# Patient Record
Sex: Female | Born: 1955 | Race: Black or African American | Hispanic: No | Marital: Married | Smoking: Never smoker
Health system: Southern US, Community
[De-identification: ages and names within clinical notes are randomized; demographics above are authoritative.]

## PROBLEM LIST (undated history)

## (undated) DIAGNOSIS — M1990 Unspecified osteoarthritis, unspecified site: Secondary | ICD-10-CM

## (undated) DIAGNOSIS — G4730 Sleep apnea, unspecified: Secondary | ICD-10-CM

## (undated) DIAGNOSIS — N1831 Chronic kidney disease, stage 3a: Secondary | ICD-10-CM

## (undated) DIAGNOSIS — R12 Heartburn: Secondary | ICD-10-CM

## (undated) DIAGNOSIS — I129 Hypertensive chronic kidney disease with stage 1 through stage 4 chronic kidney disease, or unspecified chronic kidney disease: Secondary | ICD-10-CM

## (undated) DIAGNOSIS — Z973 Presence of spectacles and contact lenses: Secondary | ICD-10-CM

## (undated) DIAGNOSIS — I10 Essential (primary) hypertension: Secondary | ICD-10-CM

## (undated) DIAGNOSIS — N2889 Other specified disorders of kidney and ureter: Secondary | ICD-10-CM

## (undated) DIAGNOSIS — E119 Type 2 diabetes mellitus without complications: Secondary | ICD-10-CM

## (undated) HISTORY — DX: Sleep apnea, unspecified: G47.30

## (undated) HISTORY — PX: TONSILLECTOMY: SUR1361

## (undated) HISTORY — DX: Type 2 diabetes mellitus without complications: E11.9

## (undated) HISTORY — DX: Essential (primary) hypertension: I10

## (undated) HISTORY — DX: Unspecified osteoarthritis, unspecified site: M19.90

## (undated) HISTORY — PX: ABDOMINAL HYSTERECTOMY: SHX81

---

## 2004-02-29 ENCOUNTER — Encounter: Payer: Self-pay | Admitting: Ophthalmology

## 2004-02-29 ENCOUNTER — Ambulatory Visit (HOSPITAL_COMMUNITY): Admission: RE | Admit: 2004-02-29 | Discharge: 2004-02-29 | Payer: Self-pay | Admitting: Ophthalmology

## 2005-09-25 ENCOUNTER — Emergency Department (HOSPITAL_COMMUNITY): Admission: EM | Admit: 2005-09-25 | Discharge: 2005-09-25 | Payer: Self-pay | Admitting: Emergency Medicine

## 2006-04-16 ENCOUNTER — Ambulatory Visit: Payer: Self-pay | Admitting: Family Medicine

## 2006-06-11 ENCOUNTER — Ambulatory Visit: Payer: Self-pay | Admitting: Family Medicine

## 2006-06-27 ENCOUNTER — Ambulatory Visit: Payer: Self-pay | Admitting: Family Medicine

## 2006-11-05 ENCOUNTER — Ambulatory Visit: Payer: Self-pay | Admitting: Obstetrics and Gynecology

## 2006-11-15 ENCOUNTER — Inpatient Hospital Stay: Payer: Self-pay | Admitting: Obstetrics and Gynecology

## 2009-01-19 ENCOUNTER — Emergency Department (HOSPITAL_COMMUNITY): Admission: EM | Admit: 2009-01-19 | Discharge: 2009-01-19 | Payer: Self-pay | Admitting: Family Medicine

## 2009-07-01 ENCOUNTER — Ambulatory Visit: Payer: Self-pay | Admitting: Internal Medicine

## 2009-07-31 ENCOUNTER — Encounter: Admission: RE | Admit: 2009-07-31 | Discharge: 2009-07-31 | Payer: Self-pay | Admitting: Neurology

## 2009-08-06 ENCOUNTER — Ambulatory Visit: Payer: Self-pay | Admitting: Family Medicine

## 2011-01-26 ENCOUNTER — Ambulatory Visit: Payer: Self-pay | Admitting: Family Medicine

## 2011-08-10 ENCOUNTER — Ambulatory Visit: Payer: Self-pay | Admitting: Unknown Physician Specialty

## 2011-08-12 LAB — PATHOLOGY REPORT

## 2012-02-27 IMAGING — CT CT HEAD WITHOUT CONTRAST
1 series · 16 of 30 positions shown, 20 images · non-contrast
Comparison: none

REASON FOR EXAM: Syncope    TIA   episode of slurred speech
COMMENTS:

[Series 2: soft tissue · axial · 0.39mm/px · z∈[-116,+19]mm · 16 of 30 slices shown, 20 images]
[im 2/30  brain]
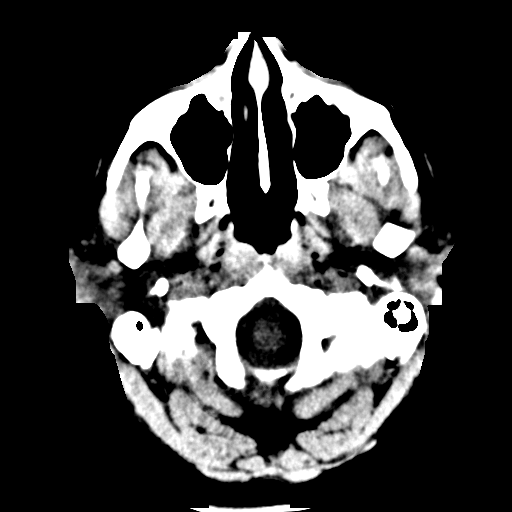
[im 2/30  bone]
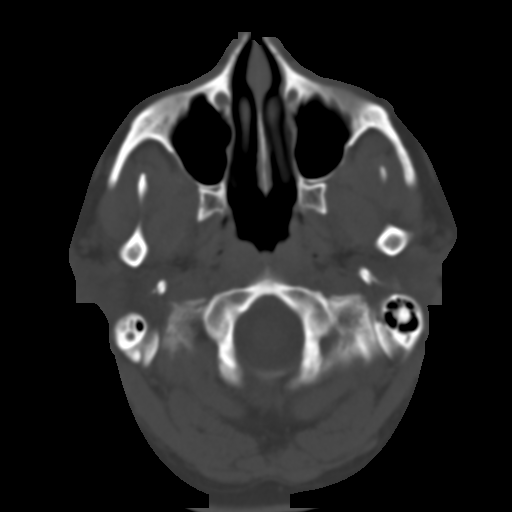
[im 4/30  brain]
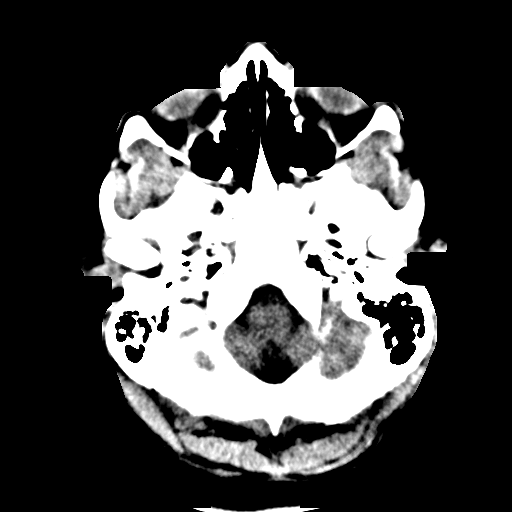
[im 6/30  brain]
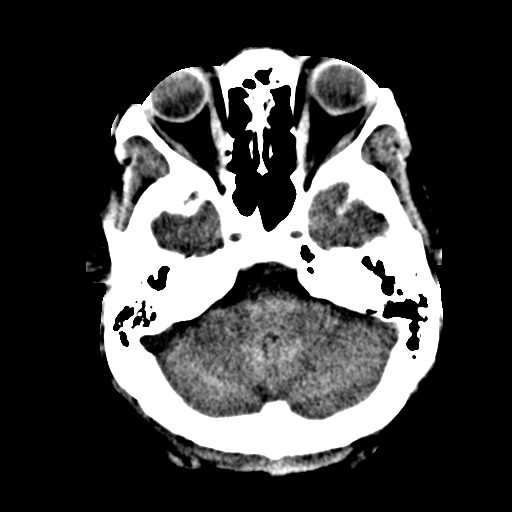
[im 8/30  brain]
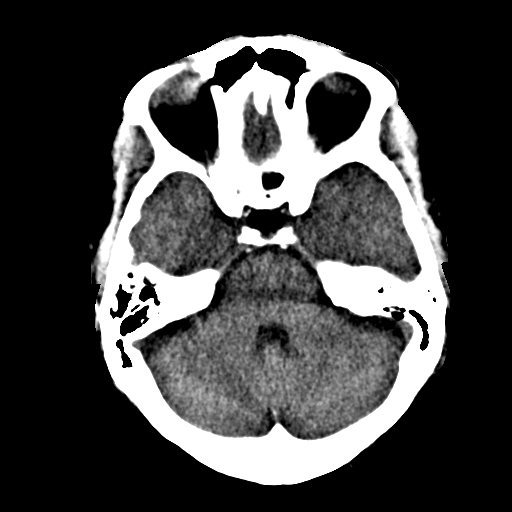
[im 9/30  brain]
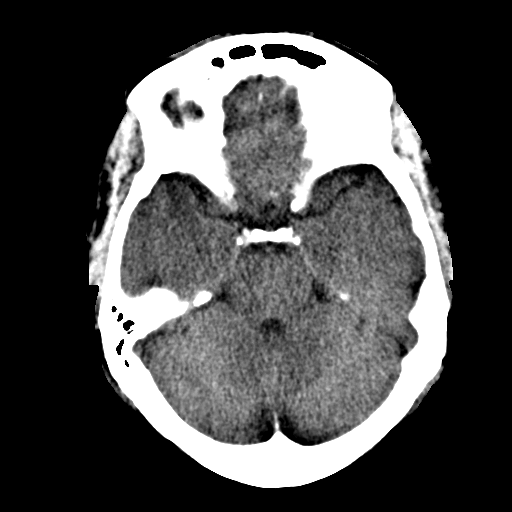
[im 9/30  bone]
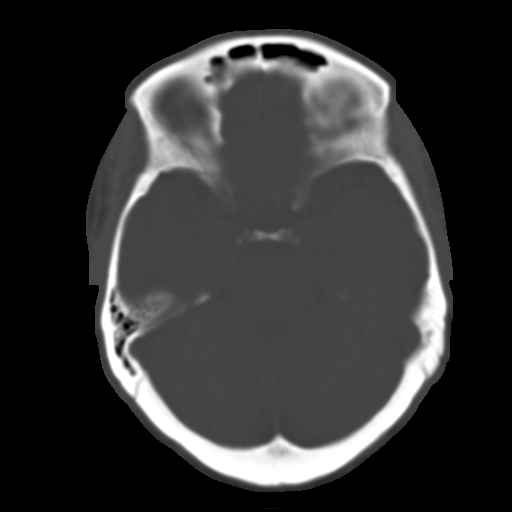
[im 11/30  brain]
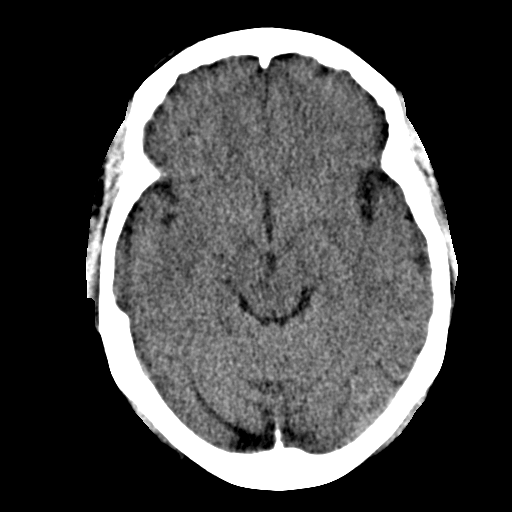
[im 13/30  brain]
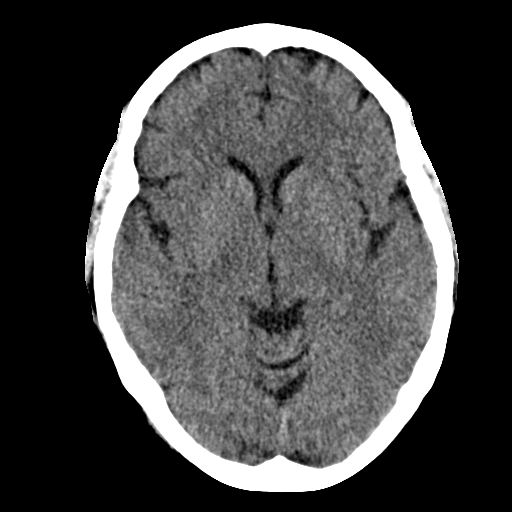
[im 15/30  brain]
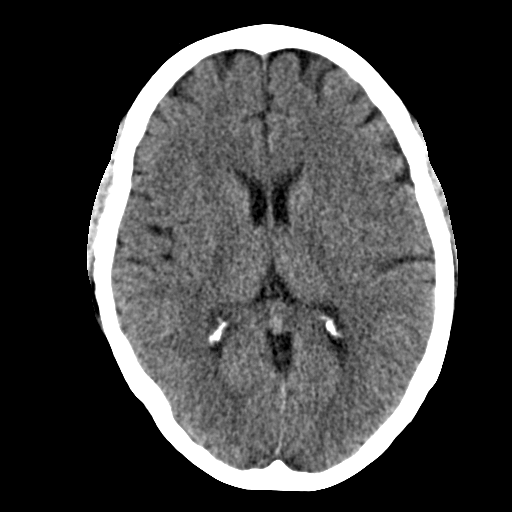
[im 16/30  brain]
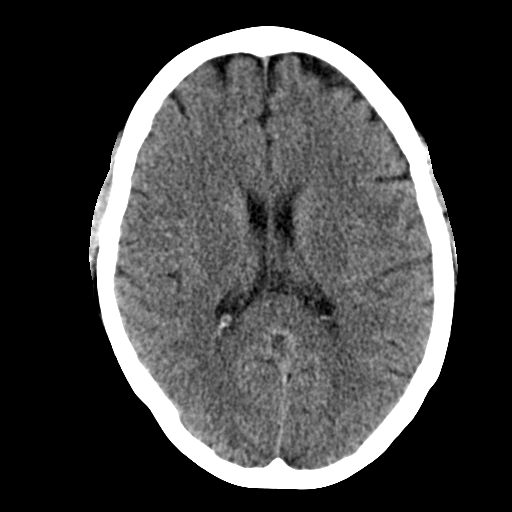
[im 16/30  bone]
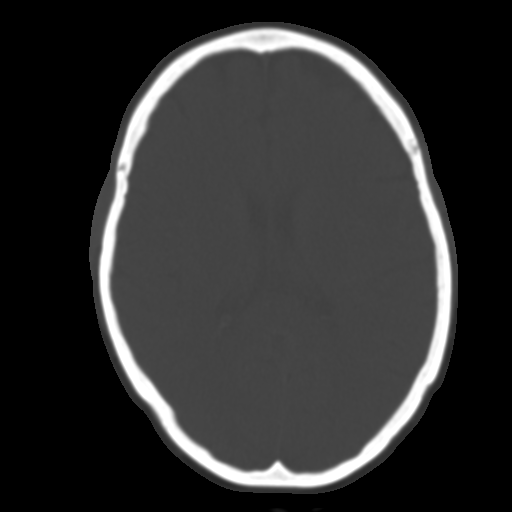
[im 18/30  brain]
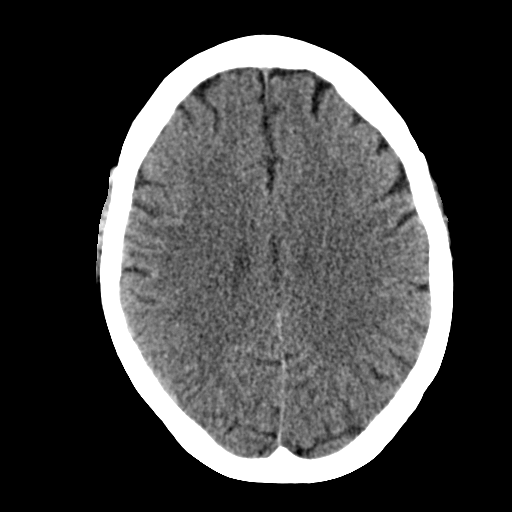
[im 20/30  brain]
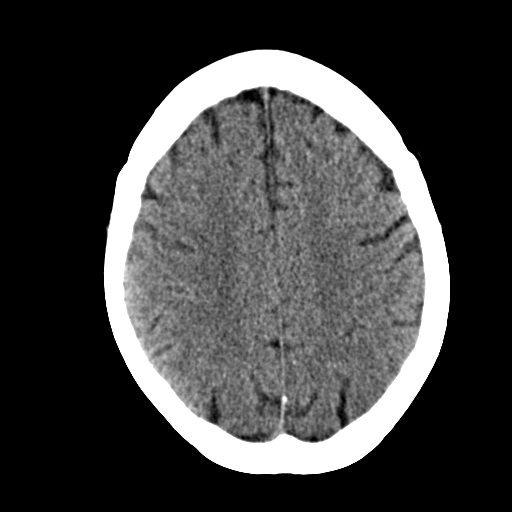
[im 22/30  brain]
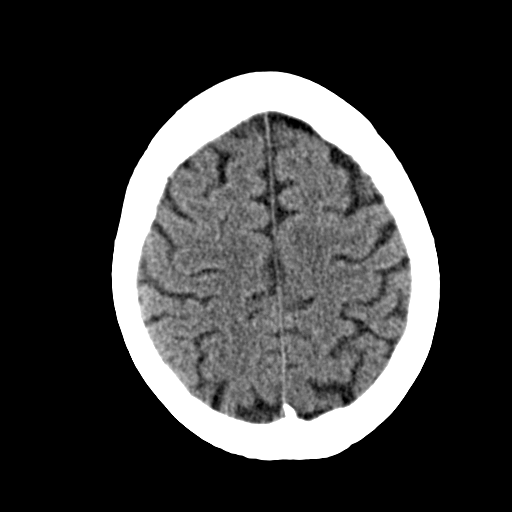
[im 23/30  brain]
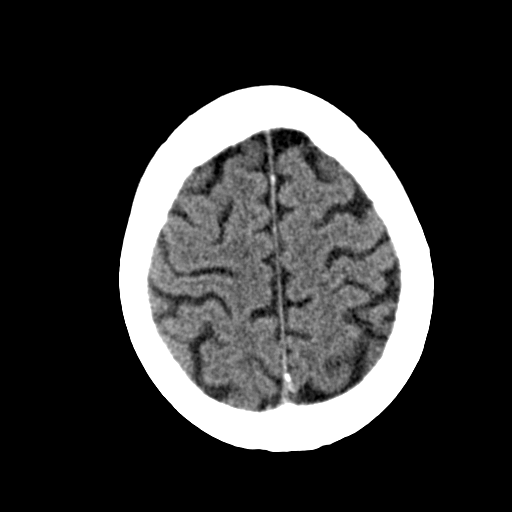
[im 23/30  bone]
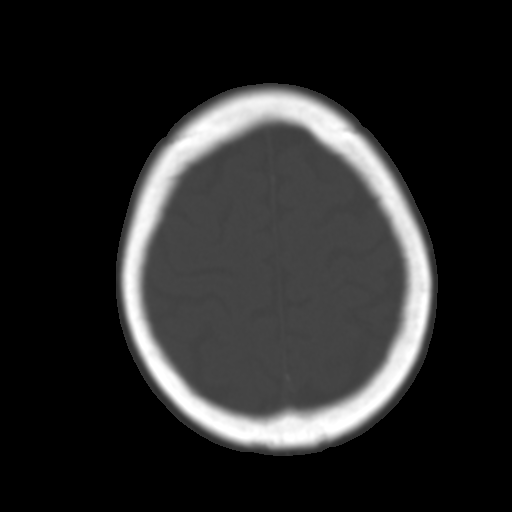
[im 25/30  brain]
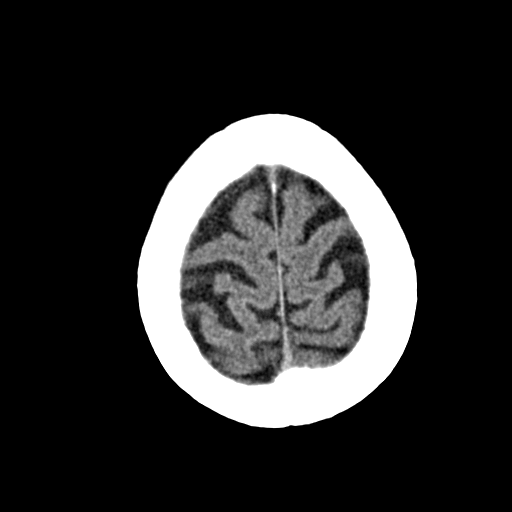
[im 27/30  brain]
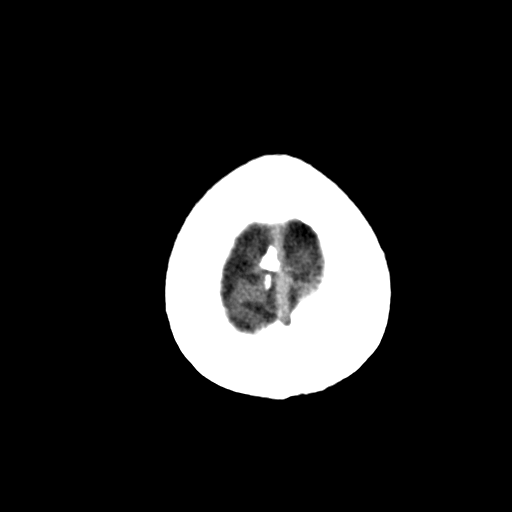
[im 29/30  brain]
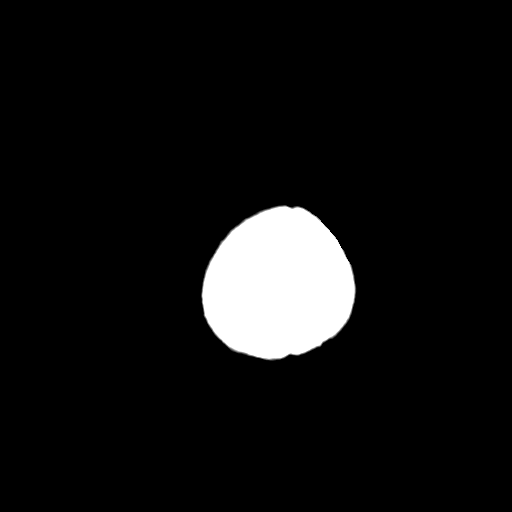

[16 of 30 positions shown; findings below may reference images not displayed]

PROCEDURE:     CT  - CT HEAD WITHOUT CONTRAST  - July 01, 2009 [DATE]

RESULT:     Axial noncontrast CT scanning was performed through the brain at
5 mm intervals and slice thicknesses.

The ventricles are normal in size and position. There is no intracranial
hemorrhage nor intracranial mass effect. I see no evidence of an evolving
ischemic infarction. The cerebellum and brainstem are normal in density.

At bone window settings the observed portions of the paranasal sinuses and
mastoid air cells are clear. There is no evidence of an acute skull fracture.
IMPRESSION: 1. I do not see evidence of an acute ischemic or hemorrhagic infarction.
Followup imaging is available upon request.
2. I see no acute abnormality elsewhere within the brain.

## 2015-03-07 ENCOUNTER — Institutional Professional Consult (permissible substitution): Payer: Self-pay | Admitting: Internal Medicine

## 2015-04-02 ENCOUNTER — Other Ambulatory Visit (HOSPITAL_COMMUNITY): Payer: Self-pay | Admitting: General Surgery

## 2015-04-26 ENCOUNTER — Ambulatory Visit (INDEPENDENT_AMBULATORY_CARE_PROVIDER_SITE_OTHER): Payer: Managed Care, Other (non HMO) | Admitting: Licensed Clinical Social Worker

## 2015-05-02 ENCOUNTER — Ambulatory Visit (HOSPITAL_COMMUNITY): Admission: RE | Admit: 2015-05-02 | Payer: Self-pay | Source: Ambulatory Visit

## 2015-05-02 ENCOUNTER — Other Ambulatory Visit (HOSPITAL_COMMUNITY): Payer: Self-pay

## 2015-05-10 ENCOUNTER — Ambulatory Visit: Payer: Managed Care, Other (non HMO) | Admitting: Licensed Clinical Social Worker

## 2015-05-27 ENCOUNTER — Ambulatory Visit (INDEPENDENT_AMBULATORY_CARE_PROVIDER_SITE_OTHER): Payer: Managed Care, Other (non HMO) | Admitting: Licensed Clinical Social Worker

## 2015-05-29 ENCOUNTER — Encounter: Payer: Managed Care, Other (non HMO) | Attending: General Surgery | Admitting: Dietician

## 2015-05-29 ENCOUNTER — Encounter: Payer: Self-pay | Admitting: Dietician

## 2015-05-29 DIAGNOSIS — I10 Essential (primary) hypertension: Secondary | ICD-10-CM | POA: Insufficient documentation

## 2015-05-29 DIAGNOSIS — M47812 Spondylosis without myelopathy or radiculopathy, cervical region: Secondary | ICD-10-CM | POA: Insufficient documentation

## 2015-05-29 DIAGNOSIS — K219 Gastro-esophageal reflux disease without esophagitis: Secondary | ICD-10-CM | POA: Diagnosis not present

## 2015-05-29 DIAGNOSIS — M1990 Unspecified osteoarthritis, unspecified site: Secondary | ICD-10-CM | POA: Diagnosis not present

## 2015-05-29 DIAGNOSIS — Z6841 Body Mass Index (BMI) 40.0 and over, adult: Secondary | ICD-10-CM | POA: Diagnosis not present

## 2015-05-29 DIAGNOSIS — G4730 Sleep apnea, unspecified: Secondary | ICD-10-CM | POA: Diagnosis not present

## 2015-05-29 NOTE — Patient Instructions (Signed)
Follow Pre-Op Goals Try Protein Shakes Call [facility] at [phone] when surgery is scheduled to enroll in Pre-Op Class  Things to remember:  Please always be honest with us. We want to support you!  If you have any questions or concerns in between appointments, please call or email Liz, Lansing Sigmon, or Laurie.  The diet after surgery will be high protein and low in carbohydrate.  Vitamins and calcium need to be taken for the rest of your life.  Feel free to include support people in any classes or appointments.   Supplement recommendations:  "Complete" Multivitamin: Sleeve Gastrectomy and RYGB patients take a double dose of MVI. Vitamin must be liquid or chewable but not gummy. Examples of these include Flintstones Complete and Centrum Complete. If the vitamin is bariatric-specific, take 1 dose as it is already formulated for bariatric surgery patients. Examples of these are Bariatric Advantage, Celebrate, and Wellesse. These can be found at the [facility] and/or online.     Calcium citrate: 1500 mg/day of Calcium citrate (also chewable or liquid) is recommended for all procedures. The body is only able to absorb 500-600 mg of Calcium at one time so 3 daily doses of 500 mg are recommended. Calcium doses must be taken a minimum of 2 hours apart. Additionally, Calcium must be taken 2 hours apart from iron-containing MVI. Examples of brands include Celebrate, Bariatric Advantage, and Wellesse. These brands must be purchased online or at the [facility]. Citracal Petites is the only Calcium citrate supplement found in general grocery stores and pharmacies. This is in tablet form and may be recommended for patients who do not tolerate chewable Calcium.  Continued or added Vitamin D supplementation based on individual needs.    Vitamin B12: 300-500 mcg/day for Sleeve Gastrectomy and RYGB. Must be taken intramuscularly, sublingually, or inhaled nasally. Oral route  is not recommended.  

## 2015-05-29 NOTE — Progress Notes (Signed)
  Pre-Op Assessment Visit:  Pre-Operative Sleeve Gastrectomy Surgery  Medical Nutrition Therapy:  Appt start time: 1040   End time:  1115.  Patient was seen on 05/29/2015 for Pre-Operative Nutrition Assessment. Assessment and letter of approval [facility] coordinator on 05/29/2015.   Preferred Learning Style:   No preference indicated   Learning Readiness:   Ready  Handouts given during visit include:  Pre-Op Goals Bariatric Surgery Protein Shakes   During the appointment today the following Pre-Op Goals were reviewed with the patient: Maintain or lose weight as instructed by your surgeon Make healthy food choices Begin to limit portion sizes Limited concentrated sugars and fried foods Keep fat/sugar in the single digits per serving on   food labels Practice CHEWING your food  (aim for 30 chews per bite or until applesauce consistency) Practice not drinking 15 minutes before, during, and 30 minutes after each meal/snack Avoid all carbonated beverages  Avoid/limit caffeinated beverages  Avoid all sugar-sweetened beverages Consume 3 meals per day; eat every 3-5 hours Make a list of non-food related activities Aim for 64-100 ounces of FLUID daily  Aim for at least 60-80 grams of PROTEIN daily Look for a liquid protein source that contain ?15 g protein and ?5 g carbohydrate  (ex: shakes, drinks, shots)  Demonstrated degree of understanding via:  Teach Back  Teaching Method Utilized:  Visual Auditory Hands on  Barriers to learning/adherence to lifestyle change: none  [facility] to call the [facility] to enroll in Pre-Op and Post-Op Nutrition Education when surgery date is scheduled.

## 2015-06-10 ENCOUNTER — Ambulatory Visit: Payer: Managed Care, Other (non HMO)

## 2015-06-10 ENCOUNTER — Ambulatory Visit
Admission: RE | Admit: 2015-06-10 | Discharge: 2015-06-10 | Disposition: A | Discharge status: Home / Self Care | Payer: Managed Care, Other (non HMO) | Source: Ambulatory Visit | Attending: General Surgery | Admitting: General Surgery

## 2015-06-10 DIAGNOSIS — K219 Gastro-esophageal reflux disease without esophagitis: Secondary | ICD-10-CM | POA: Insufficient documentation

## 2015-07-03 ENCOUNTER — Other Ambulatory Visit: Payer: Self-pay | Admitting: Family

## 2015-07-03 DIAGNOSIS — Z1231 Encounter for screening mammogram for malignant neoplasm of breast: Secondary | ICD-10-CM

## 2015-07-04 ENCOUNTER — Encounter: Payer: Self-pay | Admitting: Dietician

## 2015-07-04 ENCOUNTER — Encounter: Payer: Managed Care, Other (non HMO) | Attending: General Surgery | Admitting: Dietician

## 2015-07-04 DIAGNOSIS — G4730 Sleep apnea, unspecified: Secondary | ICD-10-CM | POA: Insufficient documentation

## 2015-07-04 DIAGNOSIS — M1990 Unspecified osteoarthritis, unspecified site: Secondary | ICD-10-CM | POA: Insufficient documentation

## 2015-07-04 DIAGNOSIS — Z6841 Body Mass Index (BMI) 40.0 and over, adult: Secondary | ICD-10-CM | POA: Diagnosis not present

## 2015-07-04 DIAGNOSIS — K219 Gastro-esophageal reflux disease without esophagitis: Secondary | ICD-10-CM | POA: Diagnosis not present

## 2015-07-04 DIAGNOSIS — M47812 Spondylosis without myelopathy or radiculopathy, cervical region: Secondary | ICD-10-CM | POA: Diagnosis present

## 2015-07-04 DIAGNOSIS — I10 Essential (primary) hypertension: Secondary | ICD-10-CM | POA: Insufficient documentation

## 2015-07-04 NOTE — Patient Instructions (Signed)
-  Try to choose lean proteins and vegetables when possible -Start considering what your diet will be like after surgery  -Think about bringing some high protein options with you on your travels (yogurt, protein shakes, tuna packs)  -Try to make sure you eat regularly throughout the day

## 2015-07-04 NOTE — Progress Notes (Signed)
Supervised Weight Loss:  Appt start time: 0950 end time:  1005.  SWL visit 1:  Primary concerns today: Ms. Rosalia HammersRay returns for her 1st SWL appointment in preparation for sleeve gastrectomy having gained 2 pounds. She has been traveling a lot for work in the last month. She struggles to eat healthfully when she is traveling. However, she has been practicing chewing thoroughly and is also working on not drinking while eating. Has not yet tried any protein shakes. She does not have a normal routine and feels like this is a big challenge for her.   Weight: 258.8 lbs BMI: 45.9  MEDICATIONS: see list  DIETARY INTAKE:  Unable to determine regular dietary intake  Recent physical activity: did not assess today  Estimated energy needs: 1500-1700 calories  Progress Towards Goal(s):  In progress.   Nutritional Diagnosis:  [place]-3.3 Overweight/obesity related to past poor dietary habits and physical inactivity as evidenced by patient in SWL for pending bariatric surgery following dietary guidelines for continued weight loss.  Intervention:  Nutrition counseling provided. Goals: -Try to choose lean proteins and vegetables when possible -Start considering what your diet will be like after surgery  -Think about bringing some high protein options with you on your travels (yogurt, protein shakes, tuna packs)  -Try to make sure you eat regularly throughout the day  Handouts given during visit include:  none  Monitoring/Evaluation:  Dietary intake, exercise, and body weight in 4 week(s).

## 2015-08-01 ENCOUNTER — Ambulatory Visit: Payer: Self-pay | Admitting: Dietician

## 2015-08-06 ENCOUNTER — Encounter: Payer: Managed Care, Other (non HMO) | Attending: General Surgery | Admitting: Dietician

## 2015-08-06 ENCOUNTER — Encounter: Payer: Self-pay | Admitting: Dietician

## 2015-08-06 DIAGNOSIS — I10 Essential (primary) hypertension: Secondary | ICD-10-CM | POA: Diagnosis not present

## 2015-08-06 DIAGNOSIS — G4730 Sleep apnea, unspecified: Secondary | ICD-10-CM | POA: Insufficient documentation

## 2015-08-06 DIAGNOSIS — M47812 Spondylosis without myelopathy or radiculopathy, cervical region: Secondary | ICD-10-CM | POA: Diagnosis present

## 2015-08-06 DIAGNOSIS — M1990 Unspecified osteoarthritis, unspecified site: Secondary | ICD-10-CM | POA: Insufficient documentation

## 2015-08-06 DIAGNOSIS — Z6841 Body Mass Index (BMI) 40.0 and over, adult: Secondary | ICD-10-CM | POA: Diagnosis not present

## 2015-08-06 DIAGNOSIS — K219 Gastro-esophageal reflux disease without esophagitis: Secondary | ICD-10-CM | POA: Insufficient documentation

## 2015-08-06 NOTE — Patient Instructions (Signed)
-  Try to choose lean proteins (eggs, tuna, yogurt) and vegetables when possible -Try to choose lean proteins and vegetables when possible -Continue to work on not drinking while eating -Have fruit at meals after you have protein and vegetables -Or have fruit with protein shake (meal or 1/2 shake for snack)

## 2015-08-06 NOTE — Progress Notes (Signed)
Supervised Weight Loss:  Appt start time: 0850 end time:  905.  SWL visit 2:  Primary concerns today: Ms. Ashley Hardy returns for her 2nd SWL appointment in preparation for sleeve gastrectomy having lost 2 pounds. Was diagnosed with diabetes in the past month and is taking Metformin. Hgb A1c is 6.7%. Testing blood sugar and averaging 140-150 mg/dl.   Has been working on high protein snacks for traveling. Does not like beef jerky but is having slim jims. Likes [employer]. [place] for work and does better with 3 meals per day when traveling. Still working on chewing. Finding it harder to not drink during meals.    Weight: 256.5 lbs  BMI: 45.4  MEDICATIONS: see list  DIETARY INTAKE:  Unable to determine regular dietary intake  Recent physical activity: did not assess today  Estimated energy needs: 1500-1700 calories  Progress Towards Goal(s):  In progress.   Nutritional Diagnosis:  [place]-3.3 Overweight/obesity related to past poor dietary habits and physical inactivity as evidenced by patient in SWL for pending bariatric surgery following dietary guidelines for continued weight loss.  Intervention:  Nutrition counseling provided. Goals: -Try to choose lean proteins (eggs, tuna, yogurt) and vegetables when possible -Try to choose lean proteins and vegetables when possible -Continue to work on not drinking while eating -Have fruit at meals after you have protein and vegetables -Or have fruit with protein shake (meal or 1/2 shake for snack)  Handouts given during visit include:  none  Monitoring/Evaluation:  Dietary intake, exercise, and body weight in 4 week(s).

## 2015-08-23 ENCOUNTER — Encounter: Payer: Managed Care, Other (non HMO) | Attending: General Surgery | Admitting: *Deleted

## 2015-08-23 ENCOUNTER — Encounter: Payer: Self-pay | Admitting: *Deleted

## 2015-08-23 VITALS — Ht 63.0 in | Wt 260.3 lb

## 2015-08-23 DIAGNOSIS — K219 Gastro-esophageal reflux disease without esophagitis: Secondary | ICD-10-CM | POA: Insufficient documentation

## 2015-08-23 DIAGNOSIS — G4730 Sleep apnea, unspecified: Secondary | ICD-10-CM | POA: Insufficient documentation

## 2015-08-23 DIAGNOSIS — Z6841 Body Mass Index (BMI) 40.0 and over, adult: Secondary | ICD-10-CM | POA: Insufficient documentation

## 2015-08-23 DIAGNOSIS — I10 Essential (primary) hypertension: Secondary | ICD-10-CM | POA: Diagnosis not present

## 2015-08-23 DIAGNOSIS — M47812 Spondylosis without myelopathy or radiculopathy, cervical region: Secondary | ICD-10-CM | POA: Diagnosis present

## 2015-08-23 DIAGNOSIS — M1990 Unspecified osteoarthritis, unspecified site: Secondary | ICD-10-CM | POA: Insufficient documentation

## 2015-08-23 DIAGNOSIS — E119 Type 2 diabetes mellitus without complications: Secondary | ICD-10-CM

## 2015-08-23 NOTE — Patient Instructions (Signed)
Plan:  Aim for 3 Carb Choices per meal (45 grams) +/- 1 either way  Aim for 0-2 Carbs per snack if hungry  Include protein in moderation with your meals and snacks Consider reading food labels for Total Carbohydrate of foods Consider  increasing your activity level by walking daily as tolerated Continue checking BG at alternate times per day  Continue taking medication Metformin as directed by MD    

## 2015-08-23 NOTE — Progress Notes (Signed)
Diabetes Self-Management Education  Visit Type: First/Initial  Appt. Start Time: 0830 Appt. End Time: 1000  08/23/2015  Ashley Hardy, identified by name and date of birth, is a 60 y.o. female with a diagnosis of Diabetes: Type 2.   ASSESSMENT  Height  (1.6 m), weight 260 lb 4.8 oz (118.071 kg). Body mass index is 46.12 kg/(m^2).      Diabetes Self-Management Education - 08/23/15 0845    Visit Information   Visit Type First/Initial   Initial Visit   Diabetes Type Type 2   Are you currently following a meal plan? No   Are you taking your medications as prescribed? Yes   Date Diagnosed May 2017   Health Coping   How would you rate your overall health? Fair   Psychosocial Assessment   Patient Belief/Attitude about Diabetes Afraid  defeated and burned out   Self-care barriers None   Other persons present Patient   Patient Concerns Nutrition/Meal planning;Glycemic Control   Special Needs None   How often do you need to have someone help you when you read instructions, pamphlets, or other written materials from your doctor or pharmacy? 1 - Never   What is the last grade level you completed in school? graduate degree   Pre-Education Assessment   Patient understands the diabetes disease and treatment process. Needs Instruction   Patient understands incorporating nutritional management into lifestyle. Needs Instruction   Patient undertands incorporating physical activity into lifestyle. Needs Review   Patient understands using medications safely. Demonstrates understanding / competency   Patient understands monitoring blood glucose, interpreting and using results Demonstrates understanding / competency   Patient understands prevention, detection, and treatment of acute complications. Needs Review   Patient understands prevention, detection, and treatment of chronic complications. Demonstrates understanding / competency   Patient understands how to develop strategies to  address psychosocial issues. Needs Instruction   Patient understands how to develop strategies to promote health/change behavior. Needs Review   Complications   Last HgB A1C per patient/outside source 6.7 %   How often do you check your blood sugar? 1-2 times/day   Fasting Blood glucose range (mg/dL) 16-109;604-540   Have you had a dilated eye exam in the past 12 months? Yes   Have you had a dental exam in the past 12 months? Yes   Are you checking your feet? No   Dietary Intake   Breakfast grabs any food when she gets hungry. 2 Pop tarts OR sweetened cereal with skim milk OR    Snack (morning) higher protein foods now, nuts, cheese, protein bars, jerky   Lunch sandwich OR left overs    Snack (afternoon) similar to AM   Dinner skips now but may fix dinner for childern in family   Snack (evening) no   Beverage(s) coffee with creamer, water, flavored water, diet soda   Exercise   Exercise Type ADL's  plans to walk with grandson during the summer   How many days per week to you exercise? 0   Patient Education   Previous Diabetes Education No   Disease state  Definition of diabetes, type 1 and 2, and the diagnosis of diabetes   Nutrition management  Role of diet in the treatment of diabetes and the relationship between the three main macronutrients and blood glucose level;Food label reading, portion sizes and measuring food.;Carbohydrate counting   Physical activity and exercise  Role of exercise on diabetes management, blood pressure control and cardiac health.   Medications  Reviewed patients medication for diabetes, action, purpose, timing of dose and side effects.   Monitoring Identified appropriate SMBG and/or A1C goals.;Purpose and frequency of SMBG.   Chronic complications Relationship between chronic complications and blood glucose control   Psychosocial adjustment Role of stress on diabetes   Individualized Goals (developed by patient)   Nutrition Follow meal plan discussed    Physical Activity Exercise 3-5 times per week   Medications take my medication as prescribed   Monitoring  test my blood glucose as discussed   Post-Education Assessment   Patient understands the diabetes disease and treatment process. Demonstrates understanding / competency   Patient understands incorporating nutritional management into lifestyle. Demonstrates understanding / competency   Patient undertands incorporating physical activity into lifestyle. Demonstrates understanding / competency   Patient understands using medications safely. Demonstrates understanding / competency   Patient understands monitoring blood glucose, interpreting and using results Demonstrates understanding / competency   Patient understands prevention, detection, and treatment of acute complications. Demonstrates understanding / competency   Patient understands prevention, detection, and treatment of chronic complications. Demonstrates understanding / competency   Patient understands how to develop strategies to address psychosocial issues. Demonstrates understanding / competency   Outcomes   Expected Outcomes Demonstrated interest in learning. Expect positive outcomes   Future DMSE PRN   Program Status Completed      Individualized Plan for Diabetes Self-Management Training:   Learning Objective:  Patient will have a greater understanding of diabetes self-management. Patient education plan is to attend individual and/or group sessions per assessed needs and concerns.   Plan:   Patient Instructions  Plan:  Aim for 3 Carb Choices per meal (45 grams) +/- 1 either way  Aim for 0-2 Carbs per snack if hungry  Include protein in moderation with your meals and snacks Consider reading food labels for Total Carbohydrate of foods Consider  increasing your activity level by walking daily as tolerated Continue checking BG at alternate times per day  Continue taking medication Metformin as directed by  MD      Expected Outcomes:  Demonstrated interest in learning. Expect positive outcomes  Education material provided: Living Well with Diabetes, A1C conversion sheet, Meal plan card and Carbohydrate counting sheet  If problems or questions, patient to contact team via:  Phone and Email  Future DSME appointment: PRN

## 2015-08-27 ENCOUNTER — Ambulatory Visit: Payer: Self-pay | Admitting: Skilled Nursing Facility1

## 2015-09-19 ENCOUNTER — Ambulatory Visit: Payer: Self-pay | Admitting: Dietician

## 2015-09-23 ENCOUNTER — Encounter: Payer: Self-pay | Admitting: Skilled Nursing Facility1

## 2015-09-23 ENCOUNTER — Encounter: Payer: Managed Care, Other (non HMO) | Attending: General Surgery | Admitting: Skilled Nursing Facility1

## 2015-09-23 VITALS — Ht 63.0 in | Wt 256.0 lb

## 2015-09-23 DIAGNOSIS — E669 Obesity, unspecified: Secondary | ICD-10-CM

## 2015-09-23 DIAGNOSIS — G4730 Sleep apnea, unspecified: Secondary | ICD-10-CM | POA: Diagnosis not present

## 2015-09-23 DIAGNOSIS — K219 Gastro-esophageal reflux disease without esophagitis: Secondary | ICD-10-CM | POA: Diagnosis not present

## 2015-09-23 DIAGNOSIS — I10 Essential (primary) hypertension: Secondary | ICD-10-CM | POA: Diagnosis not present

## 2015-09-23 DIAGNOSIS — Z6841 Body Mass Index (BMI) 40.0 and over, adult: Secondary | ICD-10-CM | POA: Insufficient documentation

## 2015-09-23 DIAGNOSIS — M1990 Unspecified osteoarthritis, unspecified site: Secondary | ICD-10-CM | POA: Diagnosis not present

## 2015-09-23 DIAGNOSIS — M47812 Spondylosis without myelopathy or radiculopathy, cervical region: Secondary | ICD-10-CM | POA: Diagnosis present

## 2015-09-23 NOTE — Progress Notes (Signed)
Supervised Weight Loss:  Appt start time: 0850 end time:  905.  SWL visit 2:  Primary concerns today: Ms. Rosalia HammersRay returns for her 4th SWL appointment in preparation for sleeve gastrectomy having lost 4 pounds. Pt states she is still working on not drinking on meals but has found the chewing recommendations to be easy. Pt states she has been drinking a shake a day.  Weight: 256 lbs  BMI: 45.4  MEDICATIONS: see list  DIETARY INTAKE:  Unable to determine regular dietary intake  Recent physical activity: did not assess today  Estimated energy needs: 1500-1700 calories  Progress Towards Goal(s):  In progress.   Nutritional Diagnosis:  [place]-3.3 Overweight/obesity related to past poor dietary habits and physical inactivity as evidenced by patient in SWL for pending bariatric surgery following dietary guidelines for continued weight loss.  Intervention:  Nutrition counseling provided. Goals: -Try to choose lean proteins (eggs, tuna, yogurt) and vegetables when possible -Try to choose lean proteins and vegetables when possible -Continue to work on not drinking while eating -Have fruit at meals after you have protein and vegetables -Or have fruit with protein shake (meal or 1/2 shake for snack)  Handouts given during visit include:  none  Monitoring/Evaluation:  Dietary intake, exercise, and body weight in 4 week(s).

## 2015-10-03 ENCOUNTER — Other Ambulatory Visit: Payer: Self-pay

## 2015-10-03 ENCOUNTER — Ambulatory Visit
Admission: RE | Admit: 2015-10-03 | Discharge: 2015-10-03 | Disposition: A | Discharge status: Home / Self Care | Payer: Managed Care, Other (non HMO) | Source: Ambulatory Visit | Attending: General Surgery | Admitting: General Surgery

## 2015-10-03 DIAGNOSIS — Z029 Encounter for administrative examinations, unspecified: Secondary | ICD-10-CM | POA: Diagnosis not present

## 2015-10-23 ENCOUNTER — Encounter: Payer: Self-pay | Admitting: Dietician

## 2015-10-23 ENCOUNTER — Encounter: Payer: Managed Care, Other (non HMO) | Attending: General Surgery | Admitting: Dietician

## 2015-10-23 DIAGNOSIS — M1990 Unspecified osteoarthritis, unspecified site: Secondary | ICD-10-CM | POA: Diagnosis not present

## 2015-10-23 DIAGNOSIS — K219 Gastro-esophageal reflux disease without esophagitis: Secondary | ICD-10-CM | POA: Diagnosis not present

## 2015-10-23 DIAGNOSIS — Z6841 Body Mass Index (BMI) 40.0 and over, adult: Secondary | ICD-10-CM | POA: Diagnosis not present

## 2015-10-23 DIAGNOSIS — G4730 Sleep apnea, unspecified: Secondary | ICD-10-CM | POA: Diagnosis not present

## 2015-10-23 DIAGNOSIS — M47812 Spondylosis without myelopathy or radiculopathy, cervical region: Secondary | ICD-10-CM | POA: Diagnosis present

## 2015-10-23 DIAGNOSIS — I10 Essential (primary) hypertension: Secondary | ICD-10-CM | POA: Diagnosis not present

## 2015-10-23 NOTE — Progress Notes (Signed)
Supervised Weight Loss:  Appt start time: 915 end time:  930  SWL visit 5:  Primary concerns today: Ms. Ashley Hardy returns for her 5th SWL appointment in preparation for sleeve gastrectomy having gained 1 pound. She reports feeling excited for surgery and really working on adopting a new lifestyle. Has been practicing chewing well and avoiding fluids with meals. Finds that she needs to be more conscious about not drinking with meals as this does not come naturally for her. Working on reducing sugar and increasing veggies. Has been having high protein snacks like nuts, beef jerky, and cheese. She drinks Premier protein shake and likes it. Has not had a diet soda in 2-3 weeks.   Weight: 257.2 lbs BMI: 45.5  MEDICATIONS: see list  DIETARY INTAKE:  Unable to determine regular dietary intake  Recent physical activity: did not assess today  Estimated energy needs: 1500-1700 calories  Progress Towards Goal(s):  In progress.   Nutritional Diagnosis:  [place]-3.3 Overweight/obesity related to past poor dietary habits and physical inactivity as evidenced by patient in SWL for pending bariatric surgery following dietary guidelines for continued weight loss.  Intervention:  Nutrition counseling provided. Goals: -Try to choose lean proteins (eggs, tuna, yogurt) and vegetables when possible -Try to choose lean proteins and vegetables when possible -Continue to work on not drinking while eating -Have fruit at meals after you have protein and vegetables -Or have fruit with protein shake (meal or 1/2 shake for snack)  Handouts given during visit include:  none  Monitoring/Evaluation:  Dietary intake, exercise, and body weight in 4 week(s).

## 2015-10-23 NOTE — Patient Instructions (Signed)
Plan:  Aim for 3 Carb Choices per meal (45 grams) +/- 1 either way  Aim for 0-2 Carbs per snack if hungry  Include protein in moderation with your meals and snacks Consider reading food labels for Total Carbohydrate of foods Consider  increasing your activity level by walking daily as tolerated Continue checking BG at alternate times per day  Continue taking medication Metformin as directed by MD

## 2015-11-18 ENCOUNTER — Encounter: Payer: Self-pay | Admitting: Dietician

## 2015-11-18 ENCOUNTER — Encounter: Payer: Managed Care, Other (non HMO) | Attending: General Surgery | Admitting: Dietician

## 2015-11-18 DIAGNOSIS — K219 Gastro-esophageal reflux disease without esophagitis: Secondary | ICD-10-CM | POA: Insufficient documentation

## 2015-11-18 DIAGNOSIS — I10 Essential (primary) hypertension: Secondary | ICD-10-CM | POA: Diagnosis not present

## 2015-11-18 DIAGNOSIS — M1990 Unspecified osteoarthritis, unspecified site: Secondary | ICD-10-CM | POA: Diagnosis not present

## 2015-11-18 DIAGNOSIS — Z6841 Body Mass Index (BMI) 40.0 and over, adult: Secondary | ICD-10-CM | POA: Diagnosis not present

## 2015-11-18 DIAGNOSIS — G4730 Sleep apnea, unspecified: Secondary | ICD-10-CM | POA: Diagnosis not present

## 2015-11-18 DIAGNOSIS — M47812 Spondylosis without myelopathy or radiculopathy, cervical region: Secondary | ICD-10-CM | POA: Diagnosis present

## 2015-11-18 NOTE — Progress Notes (Signed)
Supervised Weight Loss:  Appt start time: 800 end time:  815  SWL visit 6:  Primary concerns today: Ms. Rosalia HammersRay returns for her 6th SWL appointment in preparation for sleeve gastrectomy having lost 4 pounds. She has lost a total of more than 3 pounds over the course of her SWL visits. Her youngest son just left for college so her lifestyle has changed. She is cooking less starchy foods. Has been drinking Premier shakes 1x a day and trying to focus more on protein foods with each meal. Feels like "things have really come together" and feeling prepared for surgery.  Weight: 253.3 lbs BMI: 44.8  MEDICATIONS: see list  DIETARY INTAKE:  Unable to determine regular dietary intake  Recent physical activity: did not assess today  Estimated energy needs: 1500-1700 calories  Progress Towards Goal(s):  In progress.   Nutritional Diagnosis:  [place]-3.3 Overweight/obesity related to past poor dietary habits and physical inactivity as evidenced by patient in SWL for pending bariatric surgery following dietary guidelines for continued weight loss.  Intervention:  Nutrition counseling provided. Goals: -Try to choose lean proteins (eggs, tuna, yogurt) and vegetables when possible -Try to choose lean proteins and vegetables when possible -Continue to work on not drinking while eating -Have fruit at meals after you have protein and vegetables -Or have fruit with protein shake (meal or 1/2 shake for snack)  Handouts given during visit include:  none  Monitoring/Evaluation:  Dietary intake, exercise, and body weight in 4 week(s).

## 2015-11-25 ENCOUNTER — Encounter: Payer: Managed Care, Other (non HMO) | Admitting: Dietician

## 2015-11-28 ENCOUNTER — Encounter: Payer: Self-pay | Admitting: Internal Medicine

## 2015-11-28 ENCOUNTER — Ambulatory Visit (INDEPENDENT_AMBULATORY_CARE_PROVIDER_SITE_OTHER): Payer: Managed Care, Other (non HMO) | Admitting: Internal Medicine

## 2015-11-28 ENCOUNTER — Encounter: Payer: Self-pay | Admitting: Dietician

## 2015-11-28 VITALS — BP 150/70 | HR 88 | Ht 64.0 in | Wt 255.0 lb

## 2015-11-28 DIAGNOSIS — I10 Essential (primary) hypertension: Secondary | ICD-10-CM | POA: Diagnosis not present

## 2015-11-28 DIAGNOSIS — G4733 Obstructive sleep apnea (adult) (pediatric): Secondary | ICD-10-CM

## 2015-11-28 MED ORDER — VALSARTAN 160 MG PO TABS: 160.0000 mg | ORAL_TABLET | Freq: Every day | ORAL | 2 refills | Status: DC

## 2015-11-28 NOTE — Progress Notes (Signed)
  Pre-Operative Nutrition Class:  Appt start time: 0626   End time:  1830.  Patient was seen on 11/25/2015 for Pre-Operative Bariatric Surgery Education at [facility].   Surgery date:  Surgery type: Sleeve gastrectomy Start weight at [facility]: 256.8 lbs Weight today: 256.6 lbs  TANITA  BODY COMP RESULTS  11/25/15   BMI (kg/m^2) 44   Fat Mass (lbs) 134   Fat Free Mass (lbs) 122.6   Total Body Water (lbs) 89.8   Samples given per MNT protocol. Patient educated on appropriate usage: Premier protein shake (chocolate - qty 1) Lot #: [id number] Exp: 09/2016  Bariatric Advantage Multivitamin chew (mixed fruit - qty 1) Lot #: [id number] Exp: 09/2016  Bariatric AdvantageCalcium Citrate chew (strawberry - qty 1) Lot #: [id number] Exp: 07/2016  Unjury Protein Powder (vanilla - qty 1) Lot #: [id number] Exp: 03/2017  The following the learning objectives were met by the patient during this course:  Identify Pre-Op Dietary Goals and will begin 2 weeks pre-operatively  Identify appropriate sources of fluids and proteins   State protein recommendations and appropriate sources pre and post-operatively  Identify Post-Operative Dietary Goals and will follow for 2 weeks post-operatively  Identify appropriate multivitamin and calcium sources  Describe the need for physical activity post-operatively and will follow MD recommendations  State when to call healthcare provider regarding medication questions or post-operative complications  Handouts given during class include:  Pre-Op Bariatric Surgery Diet Handout  Protein Shake Handout  Post-Op Bariatric Surgery Nutrition Handout  BELT Program Information Flyer  Support Group Information Flyer  [facility] Bariatric Supplements Price List  Follow-Up Plan: Patient will follow-up at [facility] 2 weeks post operatively for diet advancement per MD.

## 2015-11-28 NOTE — Progress Notes (Signed)
Subjective:     Patient ID: Rockney GheeSheila M Smith Ray, female   DOB: 04/30/1955,     MRN: [id number]  HPI  [age] yobf never smoker with fall/spring rhinitis rx with prn clariton dx and MO complicated by dm/ hyperlipidemia/ hbp with dx  osa in [place] around 2013 but off it x 2016 with some am HA but no excess drowsiness referred to pulmonary clinic 11/28/2015 by Dr   Andrey CampanileWilson for gastric sleeve   11/28/2015 [facility] office visit/ Renel Ende   Chief Complaint  Patient presents with  . Pulmonary Consult    Referred by Dr Gaynelle AduEric Wilson. Pt needing pulmonary clearance for gastric sleeve surgery. She has OSA and has a CPAP that she admits to not using regularly. She denies any respiratory co's today.    walks regularly  15-20 min around [place] at nl pace, no hills s stopping Feels ok supine / sleep is disturbed by freq urination Off lisinopril x one m Last surgery was hysterectomy in 2007 s complications   No obvious day to day or daytime variability or assoc chronic cough or cp or chest tightness, subjective wheeze or overt sinus or hb symptoms. No unusual exp hx or h/o childhood pna/ asthma or knowledge of premature birth.  Sleeping ok without nocturnal  or early am exacerbation  of respiratory  c/o's or need for noct saba. Also denies any obvious fluctuation of symptoms with weather or environmental changes or other aggravating or alleviating factors except as outlined above   Current Medications, Allergies, Complete Past Medical History, Past Surgical History, Family History, and Social History were reviewed in [facility] electronic medical record.  ROS  The following are not active complaints unless bolded sore throat, dysphagia, dental problems, itching, sneezing,  nasal congestion or excess/ purulent secretions, ear ache,   fever, chills, sweats, unintended wt loss, classically pleuritic or exertional cp, hemoptysis,  orthopnea pnd or leg swelling, presyncope, palpitations,  abdominal pain, anorexia, nausea, vomiting, diarrhea  or change in bowel or bladder habits, change in stools or urine, dysuria,hematuria,  rash, arthralgias, visual complaints, headache, numbness, weakness or ataxia or problems with walking or coordination,  change in mood/affect or memory.       Review of Systems     Objective:   Physical Exam     amb bf nad   Wt Readings from Last 3 Encounters:  11/28/15 255 lb (115.7 kg)  11/28/15 256 lb 9.6 oz (116.4 kg)  11/18/15 253 lb 4.8 oz (114.9 kg)    Vital signs reviewed   - note sats 98% RA on arrival    HEENT: nl dentition, turbinates, and oropharynx. Nl external ear canals without cough reflex Modified Mallampati Score =   3/4    NECK :  without JVD/Nodes/TM/ nl carotid upstrokes bilaterally   LUNGS: no acc muscle use,  Nl contour chest which is clear to A and P bilaterally without cough on insp or exp maneuvers   CV:  RRR  no s3 or murmur or increase in P2, no edema   ABD:  Very obese but soft and nontender with limited nspiratory excursion in the supine position. No bruits or organomegaly, bowel sounds nl  MS:  Nl gait/ ext warm without deformities, calf tenderness, cyanosis or clubbing No obvious joint restrictions   SKIN: warm and dry without lesions    NEURO:  alert, approp, nl sensorium with  no motor deficits           Assessment:

## 2015-11-28 NOTE — Patient Instructions (Addendum)
I recommend you use the cpap as much as possible and bring it with you for the surgery  Diovan 160 mg daily in place of lisinopril   Also recommend: Lots of physical activity preop  Early mobilization if possible  Minimal use of narcotics    You are cleared for surgery and I will send Dr Andrey CampanileWilson a copy  - late add: need to be sure she's had a recent TSH

## 2015-11-29 ENCOUNTER — Encounter: Payer: Self-pay | Admitting: Internal Medicine

## 2015-11-29 ENCOUNTER — Telehealth: Payer: Self-pay | Admitting: *Deleted

## 2015-11-29 DIAGNOSIS — I10 Essential (primary) hypertension: Secondary | ICD-10-CM | POA: Insufficient documentation

## 2015-11-29 DIAGNOSIS — G4733 Obstructive sleep apnea (adult) (pediatric): Secondary | ICD-10-CM | POA: Insufficient documentation

## 2015-11-29 NOTE — Assessment & Plan Note (Signed)
Dx [place] Off CPAP x 2016 with minimal sequelae (occ am HA)  She does have a high risk airway and obvious issues in supine position with limited insp excursion both due to the effects of obesity but clinically only mild OSA/ no hypersomnolence so ok to proceed with surgery though I did go over the risks involved and the need to bring her equipment with her and be prepared to use it or transition to bipap perioperatively if needed  Discussed in detail all the  indications, usual  risks and alternatives  relative to the benefits with patient who agrees to proceed with surgery and went over post op recs as well - see AVS  Total time devoted to counseling  = 35/8138m review case with pt/ discussion of options/alternatives/ personally creating written instructions  in presence of pt  then going over those specific  Instructions directly with the pt including how to use all of the meds but in particular covering each new medication in detail and the difference between the maintenance/automatic meds and the prns using an action plan format for the latter.

## 2015-11-29 NOTE — Assessment & Plan Note (Signed)
Body mass index is 43.77 kg/m.  No results found for: TSH - presume it has been checked on outside labs recently and def needs to be done preop   Contributing to gerd tendency/ doe/reviewed the need and the process to achieve and maintain neg calorie balance > defer f/u primary care including intermittently monitoring thyroid status

## 2015-11-29 NOTE — Telephone Encounter (Signed)
-----   Message from Nyoka CowdenMichael B Wert, MD sent at 11/29/2015  5:35 AM EDT ----- need to be sure she's had a recent TSH or obtain one here

## 2015-11-29 NOTE — Assessment & Plan Note (Addendum)
ACE inhibitors are problematic in  pts with airway complaints (including in my opinion OSA) because  even experienced pulmonologists can't always distinguish ace effects from copd/asthma.  By themselves they don't actually cause a problem, much like oxygen can't by itself start a fire, but they certainly serve as a powerful catalyst or enhancer for any "fire"  or inflammatory process in the upper airway, be it caused by an ET  tube or more commonly reflux (especially in the obese or pts with known GERD or who are on biphoshonates).    In the era of ARB near equivalency  Heading into surgery rec diovan 160 mg daily and defer to [facility] whether to permanently avoid acei's here

## 2015-11-29 NOTE — Telephone Encounter (Signed)
Spoke with the pt and she states that she had TSH checked by her PCP, Dr Boneta LucksJennifer Brown  Called Dr Theora GianottiBrown's office and they will not release records without a signed release  Unfortunately, we never had her sign a release  MW aware

## 2015-12-23 NOTE — Progress Notes (Signed)
Surgery on 01/14/16.  Need orders in EPIC. Thank You.

## 2016-01-08 NOTE — Progress Notes (Signed)
Dr Andrey CampanileWilson-  COMING FOR PRE OP TOMORROW AM--PLEASE PLACE SURGICAL ORDERS IN EPIC      Thanks

## 2016-01-08 NOTE — Patient Instructions (Addendum)
Ashley BradfordSheila M Hardy  01/08/2016   Your procedure is scheduled on: 01/14/16  [facility]  [place] to 3rd floor to  Short Stay Center at 0515 AM.  Call this number if you have problems the morning of surgery [phone] BRING CPAP MASK AND TUBING WITH YOU TO THE HOSPITAL  Remember: ONLY 1 PERSON MAY GO WITH YOU TO SHORT STAY TO GET  READY MORNING OF YOUR SURGERY.  Do not eat food or drink liquids :After Midnight.     Take these medicines the morning of surgery with A SIP OF WATER: NONE DO NOT TAKE ANY DIABETIC MEDICATIONS DAY OF YOUR SURGERY                               You may not have any metal on your body including hair pins and              piercings  Do not wear jewelry, make-up, lotions, powders or perfumes, deodorant             Do not wear nail polish.  Do not shave  48 hours prior to surgery.              Men may shave face and neck.   Do not bring valuables to the hospital. [facility] IS NOT             RESPONSIBLE   FOR VALUABLES.  Contacts, dentures or bridgework may not be worn into surgery.  Leave suitcase in the car. After surgery it may be brought to your room.              Please read over the following fact sheets you were given: _____________________________________________________________________ How to Manage Your Diabetes Before and After Surgery  Why is it important to control my blood sugar before and after surgery? . Improving blood sugar levels before and after surgery helps healing and can limit problems. . A way of improving blood sugar control is eating a healthy diet by: o  Eating less sugar and carbohydrates o  Increasing activity/exercise o  Talking with your doctor about reaching your blood sugar goals . High blood sugars (greater than 180 mg/dL) can raise your risk of infections and slow your recovery, so you will need to focus on controlling your diabetes during the weeks before  surgery. . Make sure that the doctor who takes care of your diabetes knows about your planned surgery including the date and location.  How do I manage my blood sugar before surgery? . Check your blood sugar at least 4 times a day, starting 2 days before surgery, to make sure that the level is not too high or low. o Check your blood sugar the morning of your surgery when you wake up and every 2 hours until you get to the Short Stay unit. . If your blood sugar is less than 70 mg/dL, you will need to treat for low blood sugar: o Do not take insulin. o Treat a low blood sugar (less than 70 mg/dL) with  cup of clear juice (cranberry or apple), 4 glucose tablets, OR glucose gel. o Recheck blood sugar in 15 minutes after treatment (to make sure it is greater than 70 mg/dL). If your blood sugar is not greater than 70 mg/dL on recheck, call  [phone] for further instructions. . Report your blood sugar to the short stay nurse when you get to Short Stay.  . If you are admitted to the hospital after surgery: o Your blood sugar will be checked by the staff and you will probably be given insulin after surgery (instead of oral diabetes medicines) to make sure you have good blood sugar levels. o The goal for blood sugar control after surgery is 80-180 mg/dL.   WHAT DO I DO ABOUT MY DIABETES MEDICATION?  Marland Kitchen. Do not take oral diabetes medicines (pills) the morning of surgery.    .      Patient Signature:  Date:   Nurse Signature:  Date:   Reviewed and Endorsed by [facility] Patient Education Committee, August 2015            [facility] - Preparing for [facility] Before surgery, you can play an important role.  Because skin is not sterile, your skin needs to be as free of germs as possible.  You can reduce the number of germs on your skin by washing with CHG (chlorahexidine gluconate) soap before surgery.  CHG is an antiseptic cleaner which kills germs and bonds with the skin to continue killing  germs even after washing. Please DO NOT use if you have an allergy to CHG or antibacterial soaps.  If your skin becomes reddened/irritated stop using the CHG and inform your nurse when you arrive at Short Stay. Do not shave (including legs and underarms) for at least 48 hours prior to the first CHG shower.  You may shave your face/neck. Please follow these instructions carefully:  1.  Shower with CHG Soap the night before surgery and the  morning of Surgery.  2.  If you choose to wash your hair, wash your hair first as usual with your  normal  shampoo.  3.  After you shampoo, rinse your hair and body thoroughly to remove the  shampoo.                           4.  Use CHG as you would any other liquid soap.  You can apply chg directly  to the skin and wash                       Gently with a scrungie or clean washcloth.  5.  Apply the CHG Soap to your body ONLY FROM THE NECK DOWN.   Do not use on face/ open                           Wound or open sores. Avoid contact with eyes, ears mouth and genitals (private parts).                       Wash face,  Genitals (private parts) with your normal soap.             6.  Wash thoroughly, paying special attention to the area where your surgery  will be performed.  7.  Thoroughly rinse your body with warm water from the neck down.  8.  DO NOT shower/wash with your normal soap after using and rinsing off  the CHG Soap.                9.  Pat yourself dry with a clean towel.  10.  Wear clean pajamas.            11.  Place clean sheets on your bed the night of your first shower and do not  sleep with pets. Day of Surgery : Do not apply any lotions/deodorants the morning of surgery.  Please wear clean clothes to the hospital/surgery center.  FAILURE TO FOLLOW THESE INSTRUCTIONS MAY RESULT IN THE CANCELLATION OF YOUR SURGERY PATIENT SIGNATURE_________________________________  NURSE  SIGNATURE__________________________________  ________________________________________________________________________

## 2016-01-09 ENCOUNTER — Encounter (HOSPITAL_COMMUNITY): Payer: Self-pay | Admitting: *Deleted

## 2016-01-09 ENCOUNTER — Encounter (HOSPITAL_COMMUNITY)
Admission: RE | Admit: 2016-01-09 | Discharge: 2016-01-09 | Disposition: A | Discharge status: Home / Self Care | Payer: Managed Care, Other (non HMO) | Source: Ambulatory Visit | Attending: General Surgery | Admitting: General Surgery

## 2016-01-09 DIAGNOSIS — Z01812 Encounter for preprocedural laboratory examination: Secondary | ICD-10-CM | POA: Diagnosis present

## 2016-01-09 HISTORY — DX: Heartburn: R12

## 2016-01-09 LAB — CBC
HEMATOCRIT: 42.6 % (ref 36.0–46.0)
HEMOGLOBIN: 13.7 g/dL (ref 12.0–15.0)
MCH: 28.2 pg (ref 26.0–34.0)
MCHC: 32.2 g/dL (ref 30.0–36.0)
MCV: 87.7 fL (ref 78.0–100.0)
Platelets: 388 10*3/uL (ref 150–400)
RBC: 4.86 MIL/uL (ref 3.87–5.11)
RDW: 13.7 % (ref 11.5–15.5)
WBC: 4.3 10*3/uL (ref 4.0–10.5)

## 2016-01-09 LAB — BASIC METABOLIC PANEL
ANION GAP: 5 (ref 5–15)
BUN: 10 mg/dL (ref 6–20)
CHLORIDE: 108 mmol/L (ref 101–111)
CO2: 26 mmol/L (ref 22–32)
Calcium: 9 mg/dL (ref 8.9–10.3)
Creatinine, Ser: 0.94 mg/dL (ref 0.44–1.00)
GFR calc Af Amer: >60 mL/min (ref >60)
GLUCOSE: 119 mg/dL — AB (ref 65–99)
POTASSIUM: 4.9 mmol/L (ref 3.5–5.1)
Sodium: 139 mmol/L (ref 135–145)

## 2016-01-09 LAB — GLUCOSE, CAPILLARY: Glucose-Capillary: 118 mg/dL — ABNORMAL HIGH (ref 65–99)

## 2016-01-09 NOTE — Progress Notes (Signed)
Dr Jarrett SohoWilson--need pre op orders please

## 2016-01-10 LAB — HEMOGLOBIN A1C
Hgb A1c MFr Bld: 6.1 % — ABNORMAL HIGH (ref 4.8–5.6)
MEAN PLASMA GLUCOSE: 128 mg/dL

## 2016-01-13 ENCOUNTER — Encounter (HOSPITAL_COMMUNITY): Payer: Self-pay | Admitting: Anesthesiology

## 2016-01-13 ENCOUNTER — Ambulatory Visit: Payer: Self-pay | Admitting: General Surgery

## 2016-01-13 NOTE — Anesthesia Preprocedure Evaluation (Addendum)
Anesthesia Evaluation  Patient identified by MRN, date of birth, ID band Patient awake    Reviewed: Allergy & Precautions, NPO status , Patient's Chart, lab work & pertinent test results  Airway Mallampati: III  TM Distance: >3 FB Neck ROM: Full    Dental no notable dental hx. (+) Teeth Intact   Pulmonary sleep apnea and Continuous Positive Airway Pressure Ventilation ,    Pulmonary exam normal breath sounds clear to auscultation       Cardiovascular hypertension, Pt. on medications Normal cardiovascular exam Rhythm:Regular Rate:Normal     Neuro/Psych negative neurological ROS  negative psych ROS   GI/Hepatic Neg liver ROS, GERD  Controlled,  Endo/Other  diabetes, Well Controlled, Type 2, Oral Hypoglycemic AgentsMorbid obesity  Renal/GU negative Renal ROS     Musculoskeletal  (+) Arthritis ,   Abdominal (+) + obese,   Peds  Hematology negative hematology ROS (+)   Anesthesia Other Findings   Reproductive/Obstetrics                              Chemistry      Component Value Date/Time   NA 139 01/09/2016 0908   K 4.9 01/09/2016 0908   CL 108 01/09/2016 0908   CO2 26 01/09/2016 0908   BUN 10 01/09/2016 0908   CREATININE 0.94 01/09/2016 0908      Component Value Date/Time   CALCIUM 9.0 01/09/2016 0908     Lab Results  Component Value Date   WBC 4.3 01/09/2016   HGB 13.7 01/09/2016   HCT 42.6 01/09/2016   MCV 87.7 01/09/2016   PLT 388 01/09/2016   EKG: normal EKG, normal sinus rhythm.  Anesthesia Physical Anesthesia Plan  ASA: III  Anesthesia Plan: General   Post-op Pain Management:    Induction: Intravenous, Rapid sequence and Cricoid pressure planned  Airway Management Planned: Oral ETT  Additional Equipment:   Intra-op Plan:   Post-operative Plan: Extubation in OR  Informed Consent: I have reviewed the patients History and Physical, chart, labs and discussed  the procedure including the risks, benefits and alternatives for the proposed anesthesia with the patient or authorized representative who has indicated his/her understanding and acceptance.   Dental advisory given  Plan Discussed with: Anesthesiologist, CRNA and Surgeon  Anesthesia Plan Comments:         Anesthesia Quick Evaluation

## 2016-01-13 NOTE — Progress Notes (Signed)
[facility] Alerted staff that orders are needed for early surgery 01/15/16.

## 2016-01-13 NOTE — H&P (Signed)
Ashley Hardy. Ashley Hardy 12/31/2015 2:56 PM Location: [facility] Patient #: [id number] DOB: 03-Mar-1956 Married / Language: English / Race: Black or African American Female   History of Present Illness [name] M. Deven Audi MD; 01/01/2016 2:33 PM) Patient words: preop.  The patient is a 60 year old female who presents for a pre-op visit. She comes in today for her preoperative visit. She has been evaluated and approved for laparoscopic sleeve gastrectomy. However I contacted her on Friday to discuss results of her upper GI him for her to think about laparoscopic Roux-en-Y gastric bypass as an alternative procedure based on the results of her upper GI. On her upper GI she had evidence of reflux up to the upper thoracic esophagus. While there was no stricture, obstruction or mass in her esophagus or stomach she did have impressive reflux up to the level thoracic esophagus. She does endorse occasional reflux. Otherwise she did not see medical changes since I initially met her in the summer. She denies any trips to the emergency room or hospital.  Her evaluation also revealed prediabetes. Her A1c was 6.4. Her thiamine level was low normal at 8 otherwise her CBC, comprehensive metabolic panel, and lipid panel were acceptable and within normal limits. She saw the pulmonologist on September 21 in regards to long-term follow-up regarding her obstructive sleep apnea. Her sleep apnea is mild. She uses her CPAP as needed. He did recommend perioperative compliance with CPAP. Her chest x-ray and EKG were unremarkable.  She denies any chest pain, chest pressure, shortness of breath, dyspnea on exertion, orthopnea, paroxysmal nocturnal dyspnea, TIAs or amaurosis fugax   Problem List/Past Medical [name] Ronnie Derby, MD; 01/01/2016 2:37 PM) [place], UNSPECIFIED SPINAL OSTEOARTHRITIS COMPLICATION STATUS (W41.324) SLEEP APNEA (G47.30) MORBID OBESITY WITH BMI OF 45.0-49.9, ADULT  (E66.01) HYPERTENSION, BENIGN (I10) DIABETES MELLITUS TYPE 2 IN OBESE (E11.69)  Other Problems Ashley Curry, MD; 01/01/2016 2:37 PM) GASTROESOPHAGEAL REFLUX DISEASE, ESOPHAGITIS PRESENCE NOT SPECIFIED (K21.9) SLEEP APNEA IN ADULT (G47.30) ARTHRITIS OF KNEE (M17.10) Hemorrhoids  Past Surgical History Ashley Curry, MD; 01/01/2016 2:37 PM) Oral Surgery Tonsillectomy Hysterectomy (not due to cancer) - Partial  Diagnostic Studies History Ashley Curry, MD; 01/01/2016 2:37 PM) Colonoscopy 1-5 years ago Mammogram 1-3 years ago  Allergies ([name], LPN; 40/12/2723 3:66 PM) No Known Drug Allergies12/07/2014  Medication History Ashley Curry, MD; 01/01/2016 2:37 PM) Pravastatin Sodium ('40MG'$  Tablet, Oral) Active. Diovan ('160MG'$  Capsule, Oral) Active. Medications Reconciled Zofran ODT ('4MG'$  Tablet Disint, 1 (one) Tablet Disperse Oral every six hours, as needed, Taken starting 12/31/2015) Active. No Current Medications (Taken starting 01/01/2016) Protonix ('40MG'$  Tablet DR, 1 (one) Tablet Oral daily, Taken starting 12/31/2015) Active. OxyCODONE HCl ('5MG'$ /5ML Solution, 5-10 Milliliter Oral every four hours, as needed, Taken starting 12/31/2015) Active.  Social History Ashley Curry, MD; 01/01/2016 2:37 PM) Tobacco use Never smoker. Alcohol use Occasional alcohol use. Caffeine use Coffee, Tea. No drug use  Family History Ashley Curry, MD; 01/01/2016 2:37 PM) Alcohol Abuse Father. Arthritis Mother. Cancer Father. Hypertension Mother.  Pregnancy / Birth History Ashley Curry, MD; 01/01/2016 2:37 PM) Maternal age 61-25 Para 1 Irregular periods Age at menarche 47 years. Age of menopause 60-50 Gravida 1    Review of Systems [name] M. Hideo Googe MD; 01/01/2016 2:34 PM) General Present- Night Sweats and Weight Gain. Not Present- Appetite Loss, Chills, Fatigue, Fever and Weight Loss. HEENT Present- Wears glasses/contact lenses. Not Present-  Earache, Hearing Loss, Hoarseness, Nose Bleed, Oral Ulcers, Ringing  in the Ears, Seasonal Allergies, Sinus Pain, Sore Throat, Visual Disturbances and Yellow Eyes. Respiratory Present- Snoring. Not Present- Bloody sputum, Chronic Cough, Difficulty Breathing and Wheezing. Gastrointestinal Present- Bloating and Excessive gas. Not Present- Abdominal Pain, Bloody Stool, Change in Bowel Habits, Chronic diarrhea, Constipation, Difficulty Swallowing, Gets full quickly at meals, Hemorrhoids, Indigestion, Nausea, Rectal Pain and Vomiting. Female Genitourinary Present- Nocturia. Not Present- Frequency, Painful Urination, Pelvic Pain and Urgency. Musculoskeletal Present- Joint Pain. Not Present- Back Pain, Joint Stiffness, Muscle Pain, Muscle Weakness and Swelling of Extremities. Neurological Not Present- Decreased Memory, Fainting, Headaches, Numbness, Seizures, Tingling, Tremor, Trouble walking and Weakness. Psychiatric Not Present- Anxiety, Bipolar, Change in Sleep Pattern, Depression, Fearful and Frequent crying. Endocrine Not Present- Cold Intolerance, Excessive Hunger, Hair Changes, Heat Intolerance, Hot flashes and New Diabetes. Hematology Not Present- Easy Bruising, Excessive bleeding, Gland problems, HIV and Persistent Infections.  Vitals ([name] LPN; 16/12/9602 5:40 PM) 12/31/2015 1:49 PM Weight: 254.6 lb Height: 63.5in Body Surface Area: 2.16 m Body Mass Index: 44.39 kg/m  Temp.: 98.1F(Oral)  Pulse: 116 (Regular)  BP: 132/80 (Sitting, Left Arm, Standard)       Physical Exam [name] M. [name] MD; 01/01/2016 2:34 PM) General Mental Status-Alert. General Appearance-Consistent with stated age. Hydration-Well hydrated. Voice-Normal. Note: morbidly obese   Head and Neck Head-normocephalic, atraumatic with no lesions or palpable masses. Trachea-midline. Thyroid Gland Characteristics - normal size and consistency.  Eye Eyeball - Bilateral-Extraocular  movements intact. Sclera/Conjunctiva - Bilateral-No scleral icterus.  Chest and Lung Exam Chest and lung exam reveals -quiet, even and easy respiratory effort with no use of accessory muscles and on auscultation, normal breath sounds, no adventitious sounds and normal vocal resonance. Inspection Chest Wall - Normal. Back - normal.  Breast - Did not examine.  Cardiovascular Cardiovascular examination reveals -normal heart sounds, regular rate and rhythm with no murmurs and normal pedal pulses bilaterally.  Abdomen Inspection Inspection of the abdomen reveals - No Hernias. Skin - Scar - Note: lower midline scar. Palpation/Percussion Palpation and Percussion of the abdomen reveal - Soft, Non Tender, No Rebound tenderness, No Rigidity (guarding) and No hepatosplenomegaly. Auscultation Auscultation of the abdomen reveals - Bowel sounds normal.  Peripheral Vascular Upper Extremity Palpation - Pulses bilaterally normal.  Neurologic Neurologic evaluation reveals -alert and oriented x 3 with no impairment of recent or remote memory. Mental Status-Normal.  Neuropsychiatric The patient's mood and affect are described as -normal. Judgment and Insight-insight is appropriate concerning matters relevant to self.  Musculoskeletal Normal Exam - Left-Upper Extremity Strength Normal and Lower Extremity Strength Normal. Normal Exam - Right-Upper Extremity Strength Normal and Lower Extremity Strength Normal. Note: b/l knee crepitus   Lymphatic Head & Neck  General Head & Neck Lymphatics: Bilateral - Description - Normal. Axillary - Did not examine. Femoral & Inguinal - Did not examine.    Assessment & Plan [name] M. Aymara Sassi MD; 01/01/2016 2:37 PM) MORBID OBESITY WITH BMI OF 45.0-49.9, ADULT (E66.01) Impression: The patient meets weight loss surgery criteria. I think the patient would be an better candidate for Laparoscopic Roux-en-Y Gastric bypass therefore we will  re-submit her packet for insurance approval  We discussed laparoscopic Roux-en-Y gastric bypass. We discussed the preoperative, operative and postoperative process. Using diagrams, I explained the surgery in detail including the performance of an EGD near the end of the surgery and an Upper GI swallow study on POD 1. We discussed the typical hospital course including a 2-3 day stay baring any complications. The patient was given Neurosurgeon. I  quoted the patient that they can expect to lose 50-70% of their excess weight with the gastric bypass. We did discuss the possibility of weight regain several years after the procedure.  We discussed the risk and benefits of surgery including but not limited to anesthesia risk, bleeding, infection, anastomotic edema requiring a few additional days in the hospital, postop nausea, possible conversion to open procedure, blood clot formation, anastomotic leak, anastomotic stricture, ulcer formation, death, respiratory complications, intestinal blockage, internal hernia, gallstone formation, vitamin and nutritional deficiencies, hair loss, weight regain injury to surrounding structures, failure to lose weight and mood changes.  We discussed that before and after surgery that there would be an alteration in their diet. I explained that we have put them on a diet 2 weeks before surgery. I also explained that they would be on a liquid diet for 2 weeks after surgery. We discussed that they would have to avoid certain foods such as sugar after surgery. We discussed the importance of physical activity as well as compliance with our dietary and supplement recommendations and routine follow-up. Current Plans Started Zofran ODT 4MG, 1 (one) Tablet Disperse every six hours, as needed, #20, 12/31/2015, No Refill. Started OxyCODONE HCl 5MG/5ML, 5-10 Milliliter every four hours, as needed, 200 Milliliter, 12/31/2015, No Refill. Started Protonix 40MG, 1 (one) Tablet daily,  #30, 30 days starting 12/31/2015, Ref. x3. SLEEP APNEA (G47.30) HYPERTENSION, BENIGN (I10) Impression: stable OSTEOARTHRITIS OF CERVICAL SPINE, UNSPECIFIED SPINAL OSTEOARTHRITIS COMPLICATION STATUS (R32.023) GASTROESOPHAGEAL REFLUX DISEASE, ESOPHAGITIS PRESENCE NOT SPECIFIED (K21.9) Impression: We really reviewed the results of her upper GI that I discussed with her by phone on Friday. We discussed that Howell up to 20% of patients he will undergo a sleeve gastrectomy can develop reflux I am very concerned that she would deftly fall into that 20% risk poole which could lead to chronic need of reflux medication and ultimately potentially revisional surgery therefore we discussed Roux-en-Y gastric bypass. She is also thought about it over the weekend. She had been given access to an EMMI video on Roux-en-Y gastric bypass. We discussed it at length today please see section above. She is in agreement and would like to proceed with a Roux-en-Y gastric bypass. I think this is the safer procedure for her with respect to reflux issues.  We will try to get insurance preauthorization for this name procedure and try to keep her on for the same operative date but I did not make a promise that we would be able to do that with 343% certainty. We will go ahead and give her her postoperative pain, heartburn, nausea prescriptions today. She was also given instructions about the preoperative bowel prep the day before. I encouraged her to contact the office should she have any additional questions between now and surgery ARTHRITIS OF KNEE (M17.10) DIABETES MELLITUS TYPE 2 IN OBESE (E11.69)  Leighton Ruff. Redmond Pulling, MD, FACS General, Bariatric, & Minimally Invasive Surgery [facility], [place]

## 2016-01-14 ENCOUNTER — Encounter (HOSPITAL_COMMUNITY): Payer: Self-pay | Admitting: *Deleted

## 2016-01-14 ENCOUNTER — Inpatient Hospital Stay (HOSPITAL_COMMUNITY): Payer: Managed Care, Other (non HMO) | Admitting: Anesthesiology

## 2016-01-14 ENCOUNTER — Inpatient Hospital Stay (HOSPITAL_COMMUNITY)
Admission: RE | Admit: 2016-01-14 | Discharge: 2016-01-16 | DRG: 621 | Disposition: A | Discharge status: Home / Self Care | Payer: Managed Care, Other (non HMO) | Source: Ambulatory Visit | Attending: General Surgery | Admitting: General Surgery

## 2016-01-14 ENCOUNTER — Encounter (HOSPITAL_COMMUNITY)
Admission: RE | Disposition: A | Discharge status: Home / Self Care | Payer: Self-pay | Source: Ambulatory Visit | Attending: General Surgery

## 2016-01-14 DIAGNOSIS — G4733 Obstructive sleep apnea (adult) (pediatric): Secondary | ICD-10-CM | POA: Diagnosis present

## 2016-01-14 DIAGNOSIS — I10 Essential (primary) hypertension: Secondary | ICD-10-CM | POA: Diagnosis present

## 2016-01-14 DIAGNOSIS — M47812 Spondylosis without myelopathy or radiculopathy, cervical region: Secondary | ICD-10-CM | POA: Diagnosis present

## 2016-01-14 DIAGNOSIS — Z9989 Dependence on other enabling machines and devices: Secondary | ICD-10-CM | POA: Diagnosis not present

## 2016-01-14 DIAGNOSIS — Z7984 Long term (current) use of oral hypoglycemic drugs: Secondary | ICD-10-CM | POA: Diagnosis not present

## 2016-01-14 DIAGNOSIS — Z79899 Other long term (current) drug therapy: Secondary | ICD-10-CM

## 2016-01-14 DIAGNOSIS — K219 Gastro-esophageal reflux disease without esophagitis: Secondary | ICD-10-CM | POA: Diagnosis present

## 2016-01-14 DIAGNOSIS — Z6841 Body Mass Index (BMI) 40.0 and over, adult: Secondary | ICD-10-CM

## 2016-01-14 DIAGNOSIS — E119 Type 2 diabetes mellitus without complications: Secondary | ICD-10-CM | POA: Diagnosis present

## 2016-01-14 DIAGNOSIS — K210 Gastro-esophageal reflux disease with esophagitis: Secondary | ICD-10-CM | POA: Diagnosis present

## 2016-01-14 DIAGNOSIS — M1711 Unilateral primary osteoarthritis, right knee: Secondary | ICD-10-CM | POA: Diagnosis present

## 2016-01-14 DIAGNOSIS — Z79891 Long term (current) use of opiate analgesic: Secondary | ICD-10-CM | POA: Diagnosis not present

## 2016-01-14 DIAGNOSIS — R7303 Prediabetes: Secondary | ICD-10-CM | POA: Diagnosis present

## 2016-01-14 DIAGNOSIS — Z9884 Bariatric surgery status: Secondary | ICD-10-CM

## 2016-01-14 HISTORY — PX: GASTRIC ROUX-EN-Y: SHX5262

## 2016-01-14 HISTORY — PX: UPPER GI ENDOSCOPY: SHX6162

## 2016-01-14 LAB — GLUCOSE, CAPILLARY
GLUCOSE-CAPILLARY: 184 mg/dL — AB (ref 65–99)
Glucose-Capillary: 119 mg/dL — ABNORMAL HIGH (ref 65–99)

## 2016-01-14 LAB — HEMOGLOBIN AND HEMATOCRIT, BLOOD
HCT: 40.4 % (ref 36.0–46.0)
Hemoglobin: 13.3 g/dL (ref 12.0–15.0)

## 2016-01-14 SURGERY — LAPAROSCOPIC ROUX-EN-Y GASTRIC BYPASS WITH UPPER ENDOSCOPY
Anesthesia: General | Site: Abdomen

## 2016-01-14 MED ORDER — ONDANSETRON HCL 4 MG/2ML IJ SOLN
INTRAMUSCULAR | Status: AC
  Filled 2016-01-14: qty 2

## 2016-01-14 MED ORDER — LIDOCAINE 2% (20 MG/ML) 5 ML SYRINGE
INTRAMUSCULAR | Status: DC | PRN
  Administered 2016-01-14: 50 mg via INTRAVENOUS

## 2016-01-14 MED ORDER — ROCURONIUM BROMIDE 50 MG/5ML IV SOSY
PREFILLED_SYRINGE | INTRAVENOUS | Status: AC
  Filled 2016-01-14: qty 5

## 2016-01-14 MED ORDER — FENTANYL CITRATE (PF) 250 MCG/5ML IJ SOLN
INTRAMUSCULAR | Status: AC
  Filled 2016-01-14: qty 5

## 2016-01-14 MED ORDER — BUPIVACAINE LIPOSOME 1.3 % IJ SUSP
20.0000 mL | Freq: Once | INTRAMUSCULAR | Status: DC
  Filled 2016-01-14: qty 20

## 2016-01-14 MED ORDER — ACETAMINOPHEN 160 MG/5ML PO SOLN
325.0000 mg | ORAL | Status: DC | PRN
  Administered 2016-01-15: 325 mg via ORAL
  Filled 2016-01-14: qty 20.3

## 2016-01-14 MED ORDER — CHLORHEXIDINE GLUCONATE CLOTH 2 % EX PADS: 6.0000 | MEDICATED_PAD | Freq: Once | CUTANEOUS | Status: DC

## 2016-01-14 MED ORDER — ROCURONIUM BROMIDE 10 MG/ML (PF) SYRINGE
PREFILLED_SYRINGE | INTRAVENOUS | Status: DC | PRN
  Administered 2016-01-14: 20 mg via INTRAVENOUS
  Administered 2016-01-14: 40 mg via INTRAVENOUS
  Administered 2016-01-14: 10 mg via INTRAVENOUS

## 2016-01-14 MED ORDER — FAMOTIDINE IN NACL 20-0.9 MG/50ML-% IV SOLN
20.0000 mg | Freq: Two times a day (BID) | INTRAVENOUS | Status: DC
  Administered 2016-01-14 – 2016-01-16 (×4): 20 mg via INTRAVENOUS
  Filled 2016-01-14 (×4): qty 50

## 2016-01-14 MED ORDER — ONDANSETRON HCL 4 MG/2ML IJ SOLN
4.0000 mg | INTRAMUSCULAR | Status: DC | PRN
  Administered 2016-01-15: 4 mg via INTRAVENOUS
  Filled 2016-01-14: qty 2

## 2016-01-14 MED ORDER — LIDOCAINE 2% (20 MG/ML) 5 ML SYRINGE
INTRAMUSCULAR | Status: AC
  Filled 2016-01-14: qty 5

## 2016-01-14 MED ORDER — PREMIER PROTEIN SHAKE
2.0000 [oz_av] | ORAL | Status: DC
  Administered 2016-01-16 (×4): 2 [oz_av] via ORAL

## 2016-01-14 MED ORDER — SUGAMMADEX SODIUM 200 MG/2ML IV SOLN
INTRAVENOUS | Status: DC | PRN
  Administered 2016-01-14: 500 mg via INTRAVENOUS

## 2016-01-14 MED ORDER — MIDAZOLAM HCL 5 MG/5ML IJ SOLN
INTRAMUSCULAR | Status: DC | PRN
  Administered 2016-01-14: 2 mg via INTRAVENOUS

## 2016-01-14 MED ORDER — PROPOFOL 10 MG/ML IV BOLUS
INTRAVENOUS | Status: AC
  Filled 2016-01-14: qty 20

## 2016-01-14 MED ORDER — SCOPOLAMINE 1 MG/3DAYS TD PT72
MEDICATED_PATCH | TRANSDERMAL | Status: DC | PRN
  Administered 2016-01-14: 1 via TRANSDERMAL

## 2016-01-14 MED ORDER — CHLORHEXIDINE GLUCONATE 0.12 % MT SOLN
15.0000 mL | Freq: Two times a day (BID) | OROMUCOSAL | Status: DC
  Administered 2016-01-14 – 2016-01-16 (×5): 15 mL via OROMUCOSAL
  Filled 2016-01-14 (×4): qty 15

## 2016-01-14 MED ORDER — STERILE WATER FOR IRRIGATION IR SOLN
Status: DC | PRN
  Administered 2016-01-14: 1000 mL

## 2016-01-14 MED ORDER — EVICEL 5 ML EX KIT
PACK | Freq: Once | CUTANEOUS | Status: AC
  Administered 2016-01-14: 5 mL
  Filled 2016-01-14: qty 1

## 2016-01-14 MED ORDER — ACETAMINOPHEN 10 MG/ML IV SOLN
1000.0000 mg | Freq: Four times a day (QID) | INTRAVENOUS | Status: AC
  Administered 2016-01-14 – 2016-01-15 (×4): 1000 mg via INTRAVENOUS
  Filled 2016-01-14 (×5): qty 100

## 2016-01-14 MED ORDER — 0.9 % SODIUM CHLORIDE (POUR BTL) OPTIME
TOPICAL | Status: DC | PRN
  Administered 2016-01-14: 1000 mL

## 2016-01-14 MED ORDER — OXYCODONE HCL 5 MG/5ML PO SOLN: 5.0000 mg | ORAL | Status: DC | PRN

## 2016-01-14 MED ORDER — BUPIVACAINE HCL (PF) 0.25 % IJ SOLN
INTRAMUSCULAR | Status: AC
Start: 2016-01-14 — End: 2016-01-14
  Filled 2016-01-14: qty 30

## 2016-01-14 MED ORDER — METOCLOPRAMIDE HCL 5 MG/ML IJ SOLN
10.0000 mg | Freq: Once | INTRAMUSCULAR | Status: AC | PRN
  Administered 2016-01-14: 10 mg via INTRAVENOUS

## 2016-01-14 MED ORDER — ENOXAPARIN SODIUM 30 MG/0.3ML ~~LOC~~ SOLN
30.0000 mg | Freq: Two times a day (BID) | SUBCUTANEOUS | Status: DC
  Administered 2016-01-15 – 2016-01-16 (×3): 30 mg via SUBCUTANEOUS
  Filled 2016-01-14 (×3): qty 0.3

## 2016-01-14 MED ORDER — PROMETHAZINE HCL 25 MG/ML IJ SOLN: 12.5000 mg | Freq: Four times a day (QID) | INTRAMUSCULAR | Status: DC | PRN

## 2016-01-14 MED ORDER — HYDROMORPHONE HCL 1 MG/ML IJ SOLN
INTRAMUSCULAR | Status: AC
  Administered 2016-01-14: 0.5 mg via INTRAVENOUS
  Filled 2016-01-14: qty 1

## 2016-01-14 MED ORDER — FENTANYL CITRATE (PF) 100 MCG/2ML IJ SOLN
INTRAMUSCULAR | Status: AC
  Filled 2016-01-14: qty 2

## 2016-01-14 MED ORDER — METOCLOPRAMIDE HCL 5 MG/ML IJ SOLN
INTRAMUSCULAR | Status: AC
  Administered 2016-01-14: 10 mg via INTRAVENOUS
  Filled 2016-01-14: qty 2

## 2016-01-14 MED ORDER — LACTATED RINGERS IR SOLN
Status: DC | PRN
  Administered 2016-01-14: 3000 mL

## 2016-01-14 MED ORDER — BUPIVACAINE LIPOSOME 1.3 % IJ SUSP
INTRAMUSCULAR | Status: DC | PRN
  Administered 2016-01-14: 20 mL

## 2016-01-14 MED ORDER — SODIUM CHLORIDE 0.9 % IJ SOLN
INTRAMUSCULAR | Status: DC | PRN
  Administered 2016-01-14: 50 mL via INTRAVENOUS

## 2016-01-14 MED ORDER — MEPERIDINE HCL 50 MG/ML IJ SOLN: 6.2500 mg | INTRAMUSCULAR | Status: DC | PRN

## 2016-01-14 MED ORDER — CEFOTETAN DISODIUM-DEXTROSE 2-2.08 GM-% IV SOLR
INTRAVENOUS | Status: AC
  Filled 2016-01-14: qty 50

## 2016-01-14 MED ORDER — CEFOTETAN DISODIUM-DEXTROSE 2-2.08 GM-% IV SOLR
2.0000 g | INTRAVENOUS | Status: AC
  Administered 2016-01-14: 2 g via INTRAVENOUS

## 2016-01-14 MED ORDER — SUGAMMADEX SODIUM 500 MG/5ML IV SOLN
INTRAVENOUS | Status: AC
  Filled 2016-01-14: qty 5

## 2016-01-14 MED ORDER — DIPHENHYDRAMINE HCL 50 MG/ML IJ SOLN: 12.5000 mg | Freq: Three times a day (TID) | INTRAMUSCULAR | Status: DC | PRN

## 2016-01-14 MED ORDER — PROPOFOL 10 MG/ML IV BOLUS
INTRAVENOUS | Status: DC | PRN
  Administered 2016-01-14: 200 mg via INTRAVENOUS

## 2016-01-14 MED ORDER — HEPARIN SODIUM (PORCINE) 5000 UNIT/ML IJ SOLN
5000.0000 [IU] | INTRAMUSCULAR | Status: AC
  Administered 2016-01-14: 5000 [IU] via SUBCUTANEOUS
  Filled 2016-01-14: qty 1

## 2016-01-14 MED ORDER — HYDROMORPHONE HCL 1 MG/ML IJ SOLN
0.2500 mg | INTRAMUSCULAR | Status: DC | PRN
  Administered 2016-01-14 (×2): 0.5 mg via INTRAVENOUS

## 2016-01-14 MED ORDER — ONDANSETRON HCL 4 MG/2ML IJ SOLN
INTRAMUSCULAR | Status: DC | PRN
  Administered 2016-01-14: 4 mg via INTRAVENOUS

## 2016-01-14 MED ORDER — ACETAMINOPHEN 160 MG/5ML PO SOLN
650.0000 mg | ORAL | Status: DC | PRN
  Administered 2016-01-15 – 2016-01-16 (×2): 650 mg via ORAL
  Filled 2016-01-14 (×2): qty 20.3

## 2016-01-14 MED ORDER — SUCCINYLCHOLINE CHLORIDE 20 MG/ML IJ SOLN
INTRAMUSCULAR | Status: DC | PRN
  Administered 2016-01-14: 120 mg via INTRAVENOUS

## 2016-01-14 MED ORDER — LACTATED RINGERS IV SOLN
INTRAVENOUS | Status: DC | PRN
  Administered 2016-01-14 (×3): via INTRAVENOUS

## 2016-01-14 MED ORDER — FENTANYL CITRATE (PF) 100 MCG/2ML IJ SOLN
INTRAMUSCULAR | Status: DC | PRN
  Administered 2016-01-14 (×6): 50 ug via INTRAVENOUS

## 2016-01-14 MED ORDER — SCOPOLAMINE 1 MG/3DAYS TD PT72
MEDICATED_PATCH | TRANSDERMAL | Status: AC
  Filled 2016-01-14: qty 1

## 2016-01-14 MED ORDER — MIDAZOLAM HCL 2 MG/2ML IJ SOLN
INTRAMUSCULAR | Status: AC
  Filled 2016-01-14: qty 2

## 2016-01-14 MED ORDER — SODIUM CHLORIDE 0.9 % IJ SOLN
INTRAMUSCULAR | Status: AC
  Filled 2016-01-14: qty 50

## 2016-01-14 MED ORDER — MORPHINE SULFATE (PF) 2 MG/ML IV SOLN
2.0000 mg | INTRAVENOUS | Status: DC | PRN
  Administered 2016-01-14 (×2): 2 mg via INTRAVENOUS
  Filled 2016-01-14 (×2): qty 1

## 2016-01-14 MED ORDER — KCL IN DEXTROSE-NACL 20-5-0.45 MEQ/L-%-% IV SOLN
INTRAVENOUS | Status: DC
  Administered 2016-01-14 – 2016-01-16 (×4): via INTRAVENOUS
  Filled 2016-01-14 (×6): qty 1000

## 2016-01-14 SURGICAL SUPPLY — 71 items
APPLICATOR COTTON TIP 6IN STRL (MISCELLANEOUS) ×8 IMPLANT
APPLIER CLIP ROT 13.4 12 LRG (CLIP)
BLADE SURG SZ11 CARB STEEL (BLADE) ×4 IMPLANT
CABLE HIGH FREQUENCY MONO STRZ (ELECTRODE) ×4 IMPLANT
CHLORAPREP W/TINT 26ML (MISCELLANEOUS) ×8 IMPLANT
CLIP APPLIE ROT 13.4 12 LRG (CLIP) IMPLANT
CLIP SUT LAPRA TY ABSORB (SUTURE) ×8 IMPLANT
COVER SURGICAL LIGHT HANDLE (MISCELLANEOUS) IMPLANT
CUTTER FLEX LINEAR 45M (STAPLE) ×4 IMPLANT
DERMABOND ADVANCED (GAUZE/BANDAGES/DRESSINGS) ×2
DERMABOND ADVANCED .7 DNX12 (GAUZE/BANDAGES/DRESSINGS) ×2 IMPLANT
DEVICE SUT QUICK LOAD TK 5 (STAPLE) IMPLANT
DEVICE SUT TI-KNOT TK 5X26 (MISCELLANEOUS) ×3 IMPLANT
DEVICE SUTURE ENDOST 10MM (ENDOMECHANICALS) ×4 IMPLANT
DEVICE TI KNOT TK5 (MISCELLANEOUS) ×1
DRAIN PENROSE 18X1/4 LTX STRL (WOUND CARE) ×4 IMPLANT
ELECT REM PT RETURN 9FT ADLT (ELECTROSURGICAL) ×4
ELECTRODE REM PT RTRN 9FT ADLT (ELECTROSURGICAL) ×2 IMPLANT
GAUZE SPONGE 4X4 12PLY STRL (GAUZE/BANDAGES/DRESSINGS) IMPLANT
GAUZE SPONGE 4X4 16PLY XRAY LF (GAUZE/BANDAGES/DRESSINGS) ×4 IMPLANT
GLOVE BIOGEL M STRL SZ7.5 (GLOVE) IMPLANT
GOWN STRL REUS W/TWL XL LVL3 (GOWN DISPOSABLE) ×16 IMPLANT
HOVERMATT SINGLE USE (MISCELLANEOUS) ×4 IMPLANT
IRRIG SUCT STRYKERFLOW 2 WTIP (MISCELLANEOUS) ×4
IRRIGATION SUCT STRKRFLW 2 WTP (MISCELLANEOUS) ×2 IMPLANT
KIT BASIN OR (CUSTOM PROCEDURE TRAY) ×4 IMPLANT
KIT GASTRIC LAVAGE 34FR ADT (SET/KITS/TRAYS/PACK) ×4 IMPLANT
LUBRICANT JELLY K Y 4OZ (MISCELLANEOUS) ×4 IMPLANT
MARKER SKIN DUAL TIP RULER LAB (MISCELLANEOUS) ×4 IMPLANT
NEEDLE SPNL 22GX3.5 QUINCKE BK (NEEDLE) ×4 IMPLANT
PACK CARDIOVASCULAR III (CUSTOM PROCEDURE TRAY) ×4 IMPLANT
QUICK LOAD TK 5 (STAPLE)
RELOAD 45 VASCULAR/THIN (ENDOMECHANICALS) IMPLANT
RELOAD ENDO STITCH 2.0 (ENDOMECHANICALS) ×26
RELOAD STAPLE TA45 3.5 REG BLU (ENDOMECHANICALS) ×8 IMPLANT
RELOAD STAPLER BLUE 60MM (STAPLE) ×6 IMPLANT
RELOAD STAPLER GOLD 60MM (STAPLE) ×2 IMPLANT
RELOAD STAPLER WHITE 60MM (STAPLE) ×4 IMPLANT
SCISSORS LAP 5X45 EPIX DISP (ENDOMECHANICALS) ×4 IMPLANT
SEALANT SURGICAL APPL DUAL CAN (MISCELLANEOUS) IMPLANT
SHEARS HARMONIC ACE PLUS 45CM (MISCELLANEOUS) ×4 IMPLANT
SLEEVE ADV FIXATION 12X100MM (TROCAR) ×8 IMPLANT
SLEEVE XCEL OPT CAN 5 100 (ENDOMECHANICALS) IMPLANT
SOLUTION ANTI FOG 6CC (MISCELLANEOUS) ×4 IMPLANT
STAPLER ECHELON BIOABSB 60 FLE (MISCELLANEOUS) IMPLANT
STAPLER ECHELON LONG 60 440 (INSTRUMENTS) ×4 IMPLANT
STAPLER RELOAD BLUE 60MM (STAPLE) ×12
STAPLER RELOAD GOLD 60MM (STAPLE) ×4
STAPLER RELOAD WHITE 60MM (STAPLE) ×8
SUT MNCRL AB 4-0 PS2 18 (SUTURE) ×4 IMPLANT
SUT RELOAD ENDO STITCH 2 48X1 (ENDOMECHANICALS) ×12
SUT RELOAD ENDO STITCH 2.0 (ENDOMECHANICALS) ×14
SUT SURGIDAC NAB ES-9 0 48 120 (SUTURE) IMPLANT
SUT VIC AB 2-0 SH 27 (SUTURE) ×2
SUT VIC AB 2-0 SH 27X BRD (SUTURE) ×2 IMPLANT
SUTURE RELOAD END STTCH 2 48X1 (ENDOMECHANICALS) ×12 IMPLANT
SUTURE RELOAD ENDO STITCH 2.0 (ENDOMECHANICALS) ×14 IMPLANT
SYR 10ML ECCENTRIC (SYRINGE) ×4 IMPLANT
SYR 20CC LL (SYRINGE) ×8 IMPLANT
TIP RIGID 35CM EVICEL (HEMOSTASIS) ×4 IMPLANT
TOWEL OR 17X26 10 PK STRL BLUE (TOWEL DISPOSABLE) ×4 IMPLANT
TOWEL OR NON WOVEN STRL DISP B (DISPOSABLE) ×4 IMPLANT
TRAY FOLEY W/METER SILVER 16FR (SET/KITS/TRAYS/PACK) IMPLANT
TROCAR ADV FIXATION 12X100MM (TROCAR) ×4 IMPLANT
TROCAR ADV FIXATION 5X100MM (TROCAR) ×4 IMPLANT
TROCAR BLADELESS OPT 5 100 (ENDOMECHANICALS) ×4 IMPLANT
TROCAR XCEL 12X100 BLDLESS (ENDOMECHANICALS) ×4 IMPLANT
TUBING CONNECTING 10 (TUBING) ×3 IMPLANT
TUBING CONNECTING 10' (TUBING) ×1
TUBING ENDO SMARTCAP PENTAX (MISCELLANEOUS) ×4 IMPLANT
TUBING INSUF HEATED (TUBING) ×4 IMPLANT

## 2016-01-14 NOTE — Transfer of Care (Signed)
Immediate Anesthesia Transfer of Care Note  Patient: Ashley Hardy  Procedure(s) Performed: Procedure(s): LAPAROSCOPIC ROUX-EN-Y GASTRIC BYPASS WITH UPPER ENDOSCOPY (N/A) UPPER GI ENDOSCOPY  Patient Location: PACU  Anesthesia Type:General  Level of Consciousness: awake, alert  and oriented  Airway & Oxygen Therapy: Patient Spontanous Breathing and Patient connected to face mask oxygen  Post-op Assessment: Report given to RN and Post -op Vital signs reviewed and stable  Post vital signs: Reviewed and stable  Last Vitals:  Vitals:   01/14/16 0525  BP: (!) 141/90  Pulse: 92  Resp: 18  Temp: 36.8 C    Last Pain:  Vitals:   01/14/16 0525  TempSrc: Oral         Complications: No apparent anesthesia complications

## 2016-01-14 NOTE — Addendum Note (Signed)
Addendum  created 01/14/16 1112 by Mal AmabileMichael Kailie Polus, MD   Sign clinical note

## 2016-01-14 NOTE — Interval H&P Note (Signed)
History and Physical Interval Note:  01/14/2016 7:08 AM  Ashley Hardy  has presented today for surgery, with the diagnosis of MORBID OBESITY  The various methods of treatment have been discussed with the patient and family. After consideration of risks, benefits and other options for treatment, the patient has consented to  Procedure(s): LAPAROSCOPIC ROUX-EN-Y GASTRIC BYPASS WITH UPPER ENDOSCOPY (N/A) as a surgical intervention .  The patient's history has been reviewed, patient examined, no change in status, stable for surgery.  I have reviewed the patient's chart and labs.  Questions were answered to the patient's satisfaction.    [name], MD, FACS General, Bariatric, & Minimally Invasive [facility], [place]   [facility] LLCWILSON,Thayne Cindric M

## 2016-01-14 NOTE — Progress Notes (Signed)
CPAP set up for patient using hospital machine and pt home mask and tubing. Settings are Auto since pt does not know her home settings. Pt stated she can place self on but if needing help she will call.

## 2016-01-14 NOTE — Anesthesia Procedure Notes (Addendum)
Procedure Name: Intubation Performed by: Kizzie FantasiaARVER, Katisha Shimizu J Pre-anesthesia Checklist: Patient identified, Emergency Drugs available, Suction available, Patient being monitored and Timeout performed Patient Re-evaluated:Patient Re-evaluated prior to inductionOxygen Delivery Method: Circle system utilized Preoxygenation: Pre-oxygenation with 100% oxygen Intubation Type: IV induction Laryngoscope Size: Miller and 2 Grade View: Grade I Tube type: Oral Tube size: 7.0 mm Number of attempts: 1 Airway Equipment and Method: Stylet Placement Confirmation: ETT inserted through vocal cords under direct vision,  positive ETCO2 and breath sounds checked- equal and bilateral Secured at: 21 cm Tube secured with: Tape Dental Injury: Teeth and Oropharynx as per pre-operative assessment

## 2016-01-14 NOTE — Op Note (Signed)
Ashley BradfordSheila M Hardy [id number] 05/08/1955. 01/14/2016  Preoperative diagnosis:    Morbid (severe) obesity due to excess calories (BMI 43)   OSA (obstructive sleep apnea)   Essential hypertension   Prediabetes   GERD (gastroesophageal reflux disease)   Osteoarthritis of right knee  Postoperative  diagnosis:  1. same  Surgical procedure: Laparoscopic Roux-en-Y gastric bypass (ante-colic, ante-gastric); upper endoscopy  Surgeon: Atilano InaEric M Juwon Scripter, M.D. FACS  Asst.: Feliciana RossettiLuke Kinsinger MD   Anesthesia: General plus 70cc exparel  Complications: None   EBL: Minimal   Drains: None   Disposition: PACU in good condition   Indications for procedure: 60yo female with morbid obesity who has been unsuccessful at sustained weight loss. The patient's comorbidities are listed above. We discussed the risk and benefits of surgery including but not limited to anesthesia risk, bleeding, infection, blood clot formation, anastomotic leak, anastomotic stricture, ulcer formation, death, respiratory complications, intestinal blockage, internal hernia, gallstone formation, vitamin and nutritional deficiencies, injury to surrounding structures, failure to lose weight and mood changes.   Description of procedure: Patient is brought to the operating room and general anesthesia induced. The patient had received preoperative broad-spectrum IV antibiotics and subcutaneous heparin. The abdomen was widely sterilely prepped with Chloraprep and draped. Patient timeout was performed and correct patient and procedure confirmed. Access was obtained with a 12 mm Optiview trocar in the left upper quadrant and pneumoperitoneum established without difficulty. Under direct vision 12 mm trocars were placed laterally in the right upper quadrant, right upper quadrant midclavicular line, and to the left and above the umbilicus for the camera port. A 5 mm trocar was placed laterally in the left upper quadrant.  The omentum was brought into the  upper abdomen and the transverse mesocolon elevated and the ligament of Treitz clearly identified. A 40 cm biliopancreatic limb was then carefully measured from the ligament of Treitz. The small intestine was divided at this point with a single firing of the white load linear stapler. A Penrose drain was sutured to the end of the Roux-en-Y limb for later identification. A 100 cm Roux-en-Y limb was then carefully measured. At this point a side-to-side anastomosis was created between the Roux limb and the end of the biliopancreatic limb. This was accomplished with a single firing of the 60 mm white load linear stapler. The common enterotomy was closed with a running 2-0 Vicryl begun at either end of the enterotomy and tied centrally. I placed an additional 2-0 vicryl suture where there was a small gap. An antiobstruction suture was placed. Eviceal tissue sealant was placed over the anastomosis. The mesenteric defect was then closed with running 2-0 silk. The omentum was then divided with the harmonic scalpel up towards the transverse colon to allow mobility of the Roux limb toward the gastric pouch. The patient was then placed in steep reversed Trendelenburg. Through a 5 mm subxiphoid site the [facility] ret[facility] was placed and the left lobe of the liver elevated with excellent exposure of the upper stomach and hiatus. The angle of Hiss was then mobilized with the harmonic scalpel. A 4 cm gastric pouch was then carefully measured along the lesser curve of the stomach. Dissection was carried along the lesser curve at this point with the Harmonic scalpel working carefully back toward the lesser sac at right angles to the lesser curve. The free lesser sac was then entered. After being sure all tubes were removed from the stomach an initial firing of the gold load 60 mm linear stapler was fired at  right angles across the lesser curve for about 4 cm. The gastric pouch was further mobilized posteriorly and then the pouch  was completed with 2 further firings of the 60 mm blue load linear stapler and 1 final firing of a blue load 45mm linear stapler up through the previously dissected angle of His. It was ensured that the pouch was completely mobilized away from the gastric remnant. This created a nice tubular 4-5 cm gastric pouch. The Roux limb was then brought up in an antecolic fashion with the candycane facing to the patient's left without undue tension. The gastrojejunostomy was created with an initial posterior row of 2-0 Vicryl between the Roux limb and the staple line of the gastric pouch. Enterotomies were then made in the gastric pouch and the Roux limb with the harmonic scalpel and at approximately 2-2-1/2 cm anastomosis was created with a single firing of the 45mm blue load linear stapler. The staple line was inspected and was intact without bleeding. The common enterotomy was then closed with running 2-0 Vicryl begun at either end and tied centrally. The Ewall tube was then easily passed through the anastomosis and an outer anterior layer of running 2-0 Vicryl was placed. The Ewald tube was removed. With the outlet of the gastrojejunostomy clamped and under saline irrigation the assistant performed upper endoscopy and with the gastric pouch tensely distended with air-there was no evidence of leak on this test. The pouch was desufflated. The Vonita Mosseterson defect was closed with running 2-0 silk. The abdomen was inspected for any evidence of bleeding or bowel injury and everything looked fine. The Nathanson retractor was removed under direct vision after coating the anastomosis with Eviceal tissue sealant. All CO2 was evacuated and trochars removed. Skin incisions were closed with 4-0 monocryl in a subcuticular fashion followed by Dermabond. Sponge needle and instrument counts were correct. The patient was taken to the PACU in good condition.    Ashley SellaEric M. Andrey CampanileWilson, MD, FACS General, Bariatric, & Minimally Invasive  Sur[facility], [place]

## 2016-01-14 NOTE — Op Note (Signed)
Preoperative diagnosis: Roux-en-Y gastric bypass  Postoperative diagnosis: Same   Procedure: Upper endoscopy   Surgeon: Feliciana RossettiLuke Kinsinger, M.D.  Anesthesia: Gen.   Indications for procedure: This patient was undergoing a Roux-en-Y gastric bypass.   Description of procedure: The endoscopy was placed in the mouth and into the oropharynx and under endoscopic vision it was advanced to the esophagogastric junction. The pouch was insufflated and no bleeding or bubbles were seen. The GEJ was identified at 40cm from the teeth. The anastomosis was visualized at 47cm and was patent. No bleeding or leaks were detected. The scope was withdrawn without difficulty.   Feliciana RossettiLuke Kinsinger, M.D. General, Bariatric, & Minimally Invasive Surgery [facility], [place]

## 2016-01-14 NOTE — Anesthesia Postprocedure Evaluation (Addendum)
Anesthesia Post Note  Patient: Ashley BradfordSheila M Hardy  Procedure(s) Performed: Procedure(s) (LRB): LAPAROSCOPIC ROUX-EN-Y GASTRIC BYPASS WITH UPPER ENDOSCOPY (N/A) UPPER GI ENDOSCOPY  Patient location during evaluation: PACU Anesthesia Type: General Level of consciousness: awake and alert and oriented Pain management: pain level controlled Vital Signs Assessment: post-procedure vital signs reviewed and stable Respiratory status: spontaneous breathing, nonlabored ventilation, respiratory function stable and patient connected to nasal cannula oxygen Cardiovascular status: stable and blood pressure returned to baseline Postop Assessment: no signs of nausea or vomiting Anesthetic complications: no    Last Vitals:  Vitals:   01/14/16 1030 01/14/16 1045  BP: (!) 160/88 (!) 163/72  Pulse: 91 84  Resp: 12 19  Temp: 36.4 C     Last Pain:  Vitals:   01/14/16 1045  TempSrc:   PainSc: 8                  Terron Merfeld A.

## 2016-01-15 LAB — COMPREHENSIVE METABOLIC PANEL
ALT: 60 U/L — AB (ref 14–54)
AST: 46 U/L — ABNORMAL HIGH (ref 15–41)
Albumin: 3.3 g/dL — ABNORMAL LOW (ref 3.5–5.0)
Alkaline Phosphatase: 60 U/L (ref 38–126)
Anion gap: 9 (ref 5–15)
BUN: 9 mg/dL (ref 6–20)
CO2: 23 mmol/L (ref 22–32)
CREATININE: 1.02 mg/dL — AB (ref 0.44–1.00)
Calcium: 8 mg/dL — ABNORMAL LOW (ref 8.9–10.3)
Chloride: 104 mmol/L (ref 101–111)
GFR calc non Af Amer: 59 mL/min — ABNORMAL LOW (ref >60)
Glucose, Bld: 121 mg/dL — ABNORMAL HIGH (ref 65–99)
Potassium: 3.8 mmol/L (ref 3.5–5.1)
SODIUM: 136 mmol/L (ref 135–145)
Total Bilirubin: 1 mg/dL (ref 0.3–1.2)
Total Protein: 6.5 g/dL (ref 6.5–8.1)

## 2016-01-15 LAB — CBC WITH DIFFERENTIAL/PLATELET
BASOS ABS: 0 10*3/uL (ref 0.0–0.1)
Basophils Relative: 0 %
EOS ABS: 0 10*3/uL (ref 0.0–0.7)
EOS PCT: 0 %
HCT: 37.5 % (ref 36.0–46.0)
Hemoglobin: 12.2 g/dL (ref 12.0–15.0)
LYMPHS ABS: 1.3 10*3/uL (ref 0.7–4.0)
Lymphocytes Relative: 18 %
MCH: 28 pg (ref 26.0–34.0)
MCHC: 32.5 g/dL (ref 30.0–36.0)
MCV: 86.2 fL (ref 78.0–100.0)
Monocytes Absolute: 0.7 10*3/uL (ref 0.1–1.0)
Monocytes Relative: 10 %
Neutro Abs: 5.1 10*3/uL (ref 1.7–7.7)
Neutrophils Relative %: 72 %
PLATELETS: 318 10*3/uL (ref 150–400)
RBC: 4.35 MIL/uL (ref 3.87–5.11)
RDW: 13.6 % (ref 11.5–15.5)
WBC: 7.1 10*3/uL (ref 4.0–10.5)

## 2016-01-15 LAB — HEMOGLOBIN AND HEMATOCRIT, BLOOD
HEMATOCRIT: 37.9 % (ref 36.0–46.0)
Hemoglobin: 12.3 g/dL (ref 12.0–15.0)

## 2016-01-15 NOTE — Plan of Care (Signed)
Problem: Food- and Nutrition-Related Knowledge Deficit (NB-1.1) Goal: Nutrition education Formal process to instruct or train a patient/client in a skill or to impart knowledge to help patients/clients voluntarily manage or modify food choices and eating behavior to maintain or improve health. Outcome: Completed/Met Date Met: 01/15/16 Nutrition Education Note  Received consult for diet education per DROP protocol.   Discussed 2 week post op diet with pt. Emphasized that liquids must be non carbonated, non caffeinated, and sugar free. Fluid goals discussed. Reviewed progression of diet to include soft proteins at 7-10 days post-op. Pt to follow up with outpatient bariatric RD for further diet progression after 2 weeks. Multivitamins and minerals also reviewed. Teach back method used, pt expressed understanding, expect good compliance.   Diet: First 2 Weeks  You will see the dietitian about two (2) weeks after your surgery. The dietitian will increase the types of foods you can eat if you are handling liquids well:  If you have severe vomiting or nausea and cannot handle clear liquids lasting longer than 1 day, call your surgeon  Protein Shake  Drink at least 2 ounces of shake 5-6 times per day  Each serving of protein shakes (usually 8 - 12 ounces) should have a minimum of:  15 grams of protein  And no more than 5 grams of carbohydrate  Goal for protein each day:  Men = 80 grams per day  Women = 60 grams per day  Protein powder may be added to fluids such as non-fat milk or Lactaid milk or Soy milk (limit to 35 grams added protein powder per serving)   Hydration  Slowly increase the amount of water and other clear liquids as tolerated (See Acceptable Fluids)  Slowly increase the amount of protein shake as tolerated  Sip fluids slowly and throughout the day  May use sugar substitutes in small amounts (no more than 6 - 8 packets per day; i.e. Splenda)   Fluid Goal  The first goal is to  drink at least 8 ounces of protein shake/drink per day (or as directed by the nutritionist); some examples of protein shakes are Syntrax Nectar, Adkins Advantage, EAS Edge HP, and Unjury. See handout from pre-op Bariatric Education Class:  Slowly increase the amount of protein shake you drink as tolerated  You may find it easier to slowly sip shakes throughout the day  It is important to get your proteins in first  Your fluid goal is to drink 64 - 100 ounces of fluid daily  It may take a few weeks to build up to this  32 oz (or more) should be clear liquids  And  32 oz (or more) should be full liquids (see below for examples)  Liquids should not contain sugar, caffeine, or carbonation   Clear Liquids:  Water or Sugar-free flavored water (i.e. Fruit H2O, Propel)  Decaffeinated coffee or tea (sugar-free)  Crystal Lite, Wyler's Lite, Minute Maid Lite  Sugar-free Jell-O  Bouillon or broth  Sugar-free Popsicle: *Less than 20 calories each; Limit 1 per day   Full Liquids:  Protein Shakes/Drinks + 2 choices per day of other full liquids  Full liquids must be:  No More Than 12 grams of Carbs per serving  No More Than 3 grams of Fat per serving  Strained low-fat cream soup  Non-Fat milk  Fat-free Lactaid Milk  Sugar-free yogurt (Dannon Lite & Fit, Greek yogurt)     Delayla Hoffmaster, MS, RD, LDN Pager: [phone] After Hours Pager: [phone]    

## 2016-01-15 NOTE — Progress Notes (Signed)
Patient alert and oriented, Post op day 1.  Provided support and encouragement.  Encouraged pulmonary toilet, ambulation and small sips of liquids.  All questions answered.  Will continue to monitor. 

## 2016-01-15 NOTE — Progress Notes (Signed)
Patient ID: Ashley Hardy, female   DOB: 07/28/1955, 60 y.o.   MRN: [id number]  Progress Note: Metabolic and Bariatric Surgery Service   Subjective: Ambulated, some abdominal pain. Had some ice chips last night but threw them up. No more vomiting.  Objective: Vital signs in last 24 hours: Temp:  [97.6 F (36.4 C)-99.1 F (37.3 C)] 98.9 F (37.2 C) (11/08 0552) Pulse Rate:  [63-91] 63 (11/08 0552) Resp:  [12-19] 16 (11/08 0552) BP: (128-163)/(60-88) 145/64 (11/08 0552) SpO2:  [93 %-100 %] 93 % (11/08 0552) Weight:  [118.6 kg (261 lb 8 oz)] 118.6 kg (261 lb 8 oz) (11/08 0552) Last BM Date: 01/13/16  Intake/Output from previous day: 11/07 0701 - 11/08 0700 In: 3954.2 [P.O.:100; I.V.:3604.2; IV Piggyback:250] Out: 1310 [Urine:1250; Blood:60] Intake/Output this shift: Total I/O In: -  Out: 300 [Urine:300]  Lungs: clear to auscultation  Cardiovascular: regular  Abd: soft, obese, mild appropriate tenderness, incisions-clean, dry, intact  Extremities: no edema, positive SCDs  Neuro: resting comfortably, not ill appearing  Lab Results: CBC   Recent Labs  01/14/16 1054 01/15/16 0534  WBC  --  7.1  HGB 13.3 12.2  HCT 40.4 37.5  PLT  --  318   BMET  Recent Labs  01/15/16 0534  NA 136  K 3.8  CL 104  CO2 23  GLUCOSE 121*  BUN 9  CREATININE 1.02*  CALCIUM 8.0*   PT/INR No results for input(s): LABPROT, INR in the last 72 hours. ABG No results for input(s): PHART, HCO3 in the last 72 hours.  Invalid input(s): PCO2, PO2  Studies/Results:  Anti-infectives: Anti-infectives    Start     Dose/Rate Route Frequency Ordered Stop   01/14/16 0600  cefoTEtan in Dextrose 5% (CEFOTAN) IVPB 2 g     2 g Intravenous On call to O.R. 01/14/16 0549 01/14/16 0730      Medications: Scheduled Meds: . chlorhexidine  15 mL Mouth Rinse BID  . enoxaparin (LOVENOX) injection  30 mg Subcutaneous Q12H  . famotidine (PEPCID) IV  20 mg Intravenous Q12H  . [START ON  01/16/2016] protein supplement shake  2 oz Oral Q2H   Continuous Infusions: . dextrose 5 % and 0.45 % NaCl with KCl 20 mEq/L 125 mL/hr at 01/15/16 0200   PRN Meds:.oxyCODONE **AND** acetaminophen, acetaminophen (TYLENOL) oral liquid 160 mg/5 mL, diphenhydrAMINE, morphine injection, ondansetron (ZOFRAN) IV, promethazine  Assessment/Plan: Patient Active Problem List   Diagnosis Date Noted  . Prediabetes 01/14/2016  . GERD (gastroesophageal reflux disease) 01/14/2016  . Osteoarthritis of right knee 01/14/2016  . OSA (obstructive sleep apnea) 11/29/2015  . Morbid (severe) obesity due to excess calories (HCC) 11/29/2015  . Essential hypertension 11/29/2015   s/p Procedure(s): LAPAROSCOPIC ROUX-EN-Y GASTRIC BYPASS WITH UPPER ENDOSCOPY UPPER GI ENDOSCOPY 01/14/2016  Doing well, no apparent distress-no fever, no tachycardia, Continue postop day 1 diet Ambulate, pulmonary toilet Continue chemical DVT prophylaxis  Disposition:  LOS: 1 day  The patient will be in the hospital for normal postop protocol  Atilano InaWILSON,Chamia Schmutz M, MD [phone] [facility]

## 2016-01-16 LAB — CBC WITH DIFFERENTIAL/PLATELET
BASOS ABS: 0 10*3/uL (ref 0.0–0.1)
BASOS PCT: 1 %
EOS PCT: 1 %
Eosinophils Absolute: 0.1 10*3/uL (ref 0.0–0.7)
HEMATOCRIT: 38.1 % (ref 36.0–46.0)
Hemoglobin: 12.5 g/dL (ref 12.0–15.0)
LYMPHS PCT: 35 %
Lymphs Abs: 2 10*3/uL (ref 0.7–4.0)
MCH: 28.3 pg (ref 26.0–34.0)
MCHC: 32.8 g/dL (ref 30.0–36.0)
MCV: 86.4 fL (ref 78.0–100.0)
MONO ABS: 0.5 10*3/uL (ref 0.1–1.0)
Monocytes Relative: 9 %
NEUTROS ABS: 3 10*3/uL (ref 1.7–7.7)
Neutrophils Relative %: 54 %
PLATELETS: 325 10*3/uL (ref 150–400)
RBC: 4.41 MIL/uL (ref 3.87–5.11)
RDW: 13.8 % (ref 11.5–15.5)
WBC: 5.6 10*3/uL (ref 4.0–10.5)

## 2016-01-16 LAB — BASIC METABOLIC PANEL
ANION GAP: 8 (ref 5–15)
BUN: 7 mg/dL (ref 6–20)
CALCIUM: 8.4 mg/dL — AB (ref 8.9–10.3)
CO2: 23 mmol/L (ref 22–32)
Chloride: 109 mmol/L (ref 101–111)
Creatinine, Ser: 0.91 mg/dL (ref 0.44–1.00)
Glucose, Bld: 113 mg/dL — ABNORMAL HIGH (ref 65–99)
POTASSIUM: 3.8 mmol/L (ref 3.5–5.1)
Sodium: 140 mmol/L (ref 135–145)

## 2016-01-16 MED ORDER — OXYCODONE HCL 5 MG/5ML PO SOLN: 5.0000 mg | ORAL | 0 refills | Status: DC | PRN

## 2016-01-16 NOTE — Progress Notes (Signed)
Pt was given discharge instructions, all questions were answered. Patient and family were accompanied down to the ER entrance by the RN. Jaid Quirion R McClean

## 2016-01-16 NOTE — Discharge Summary (Signed)
Physician Discharge Summary  Ashley Hardy WSF:[id number] DOB: June 28, 1955 DOA: 01/14/2016  PCP: Eloise Levels, NP  Admit date: 01/14/2016 Discharge date: 01/16/2016  Recommendations for Outpatient Follow-up:  1.   Follow-up Information    [name], MD. Go on 02/07/2016.   Specialty:  General Surgery Why:  at 4:15 PM for post op check Contact information: [address] [place] [address] [phone]        [name], MD Follow up.   Specialty:  General Surgery Contact information: [place] [address] [phone]          Discharge Diagnoses:  Principal Problem:   Morbid (severe) obesity due to excess calories (HCC) Active Problems:   OSA (obstructive sleep apnea)   Essential hypertension   Prediabetes   GERD (gastroesophageal reflux disease)   Osteoarthritis of right knee   Surgical Procedure: Laparoscopic Roux-en-Y gastric bypass, upper endoscopy  Discharge Condition: Good Disposition: Home  Diet recommendation: Postoperative gastric bypass diet  Filed Weights   01/14/16 0603 01/15/16 0552 01/16/16 0515  Weight: 113.9 kg (251 lb) 118.6 kg (261 lb 8 oz) 117.2 kg (258 lb 6.4 oz)     Hospital Course:  The patient was admitted for a planned laparoscopic Roux-en-Y gastric bypass. Please see operative note. Preoperatively the patient was given 5000 units of subcutaneous heparin for DVT prophylaxis. Postoperative prophylactic Lovenox dosing was started on the morning of postoperative day 1.  The patient was started on ice chips on POD 0 but had an episode of n/v that evening. On POD 1 she was continued on water which they tolerated. On postoperative day 2 The patient's diet was advanced to protein shakes which they also tolerated. The patient was ambulating without difficulty. Their vital signs are stable without fever or tachycardia. Their hemoglobin had remained stable. The patient was maintained on their home  settings for CPAP therapy. The patient had received discharge instructions and counseling. They were deemed stable for discharge.  On postoperative day 2 she was tolerating liquids. She had no nausea or vomiting. She was ambulating without difficulty. She was having flatus and had had a bowel movement . She met criteria for discharge. She had minimal abdominal discomfort.  BP (!) 155/65 (BP Location: Right Arm)   Pulse 67   Temp 99 F (37.2 C) (Oral)   Resp 16   Ht '5\' 4"'$  (1.626 m)   Wt 117.2 kg (258 lb 6.4 oz)   SpO2 97%   BMI 44.35 kg/m   Gen: alert, NAD, non-toxic appearing Pupils: equal, no scleral icterus Pulm: Lungs clear to auscultation, symmetric chest rise CV: regular rate and rhythm Abd: soft, min tender, nondistended.  No cellulitis. No incisional hernia Ext: no edema, no calf tenderness Skin: no rash, no jaundice  Discharge Instructions  Discharge Instructions    Ambulate hourly while awake    Complete by:  As directed    Call MD for:  difficulty breathing, headache or visual disturbances    Complete by:  As directed    Call MD for:  persistant dizziness or light-headedness    Complete by:  As directed    Call MD for:  persistant nausea and vomiting    Complete by:  As directed    Call MD for:  redness, tenderness, or signs of infection (pain, swelling, redness, odor or green/yellow discharge around incision site)    Complete by:  As directed    Call MD for:  severe  uncontrolled pain    Complete by:  As directed    Call MD for:  temperature >101 F    Complete by:  As directed    Diet bariatric full liquid    Complete by:  As directed    Discharge instructions    Complete by:  As directed    See bariatric discharge instructions   Incentive spirometry    Complete by:  As directed    Perform hourly while awake       Medication List    STOP taking these medications   ibuprofen 200 MG tablet Commonly known as:  ADVIL,MOTRIN   metFORMIN 500 MG  tablet Commonly known as:  GLUCOPHAGE     TAKE these medications   oxyCODONE 5 MG/5ML solution Commonly known as:  ROXICODONE Take 5-10 mLs (5-10 mg total) by mouth every 4 (four) hours as needed for moderate pain or severe pain.   pravastatin 40 MG tablet Commonly known as:  PRAVACHOL Take 40 mg by mouth daily.   valsartan 160 MG tablet Commonly known as:  DIOVAN Take 1 tablet (160 mg total) by mouth daily. Notes to patient:  Monitor Blood Pressure Daily and keep a log for primary care physician.  You may need to make changes to your medications with rapid weight loss.        Follow-up Information    [name], MD. Go on 02/07/2016.   Specialty:  General Surgery Why:  at 4:15 PM for post op check Contact information: [address] [place] [address] [phone]        [name], MD Follow up.   Specialty:  General Surgery Contact information: [place] [address] [phone]            The results of significant diagnostics from this hospitalization (including imaging, microbiology, ancillary and laboratory) are listed below for reference.    Significant Diagnostic Studies: No results found.  Labs: Basic Metabolic Panel:  Recent Labs Lab 01/15/16 0534 01/16/16 0422  NA 136 140  K 3.8 3.8  CL 104 109  CO2 23 23  GLUCOSE 121* 113*  BUN 9 7  CREATININE 1.02* 0.91  CALCIUM 8.0* 8.4*   Liver Function Tests:  Recent Labs Lab 01/15/16 0534  AST 46*  ALT 60*  ALKPHOS 60  BILITOT 1.0  PROT 6.5  ALBUMIN 3.3*    CBC:  Recent Labs Lab 01/14/16 1054 01/15/16 0534 01/15/16 1630 01/16/16 0422  WBC  --  7.1  --  5.6  NEUTROABS  --  5.1  --  3.0  HGB 13.3 12.2 12.3 12.5  HCT 40.4 37.5 37.9 38.1  MCV  --  86.2  --  86.4  PLT  --  318  --  325    CBG:  Recent Labs Lab 01/14/16 0520 01/14/16 1045  GLUCAP 119* 184*    Principal Problem:   Morbid (severe) obesity due to excess calories  ([place]) Active Problems:   OSA (obstructive sleep apnea)   Essential hypertension   Prediabetes   GERD (gastroesophageal reflux disease)   Osteoarthritis of right knee   Time coordinating discharge: 15 min  Signed:  [name], MD [facility] [place] 01/16/2016, 9:27 AM

## 2016-01-16 NOTE — Discharge Instructions (Signed)

## 2016-01-16 NOTE — Progress Notes (Signed)
Patient alert and oriented, pain is controlled. Patient is tolerating fluids,  advanced to protein shake today, patient tolerated well. Reviewed Gastric Bypass discharge instructions with patient and patient is able to articulate understanding. Provided information on BELT program, Support Group and [facility] outpatient pharmacy. All questions answered, will continue to monitor.    

## 2016-01-28 ENCOUNTER — Encounter: Payer: Managed Care, Other (non HMO) | Attending: General Surgery

## 2016-01-28 DIAGNOSIS — I10 Essential (primary) hypertension: Secondary | ICD-10-CM | POA: Diagnosis not present

## 2016-01-28 DIAGNOSIS — G4730 Sleep apnea, unspecified: Secondary | ICD-10-CM | POA: Insufficient documentation

## 2016-01-28 DIAGNOSIS — K219 Gastro-esophageal reflux disease without esophagitis: Secondary | ICD-10-CM | POA: Insufficient documentation

## 2016-01-28 DIAGNOSIS — Z6841 Body Mass Index (BMI) 40.0 and over, adult: Secondary | ICD-10-CM | POA: Insufficient documentation

## 2016-01-28 DIAGNOSIS — M1990 Unspecified osteoarthritis, unspecified site: Secondary | ICD-10-CM | POA: Insufficient documentation

## 2016-01-28 DIAGNOSIS — M47812 Spondylosis without myelopathy or radiculopathy, cervical region: Secondary | ICD-10-CM | POA: Diagnosis present

## 2016-02-01 NOTE — Progress Notes (Signed)
Bariatric Class:  Appt start time: 1530 end time:  1630.  2 Week Post-Operative Nutrition Class  Patient was seen on 01/28/16 for Post-[facility]ative Nutrition [facility]   Surgery date: 01/14/2016 Surgery type: RYGB Start weight at [facility]: 256.8 lbs Weight today: 241.6 lbs  Weight change: 15 lbs  TANITA  BODY COMP RESULTS  11/25/15 01/28/16   BMI (kg/m^2) 44 41.5   Fat Mass (lbs) 134 127.8   Fat Free Mass (lbs) 122.6 113.8   Total Body Water (lbs) 89.8 83.0   The following the learning objectives were met by the patient during this course:  Identifies Phase 3A (Soft, High Proteins) Dietary Goals and will begin from 2 weeks post-operatively to 2 months post-operatively  Identifies appropriate sources of fluids and proteins   States protein recommendations and appropriate sources post-operatively  Identifies the need for appropriate texture modifications, mastication, and bite sizes when consuming solids  Identifies appropriate multivitamin and calcium sources post-operatively  Describes the need for physical activity post-operatively and will follow MD recommendations  States when to call healthcare provider regarding medication questions or post-operative complications  Handouts given during class include:  Phase 3A: Soft, High Protein Diet Handout  Follow-Up Plan: Patient will follow-up at [facility] in 6 weeks for 2 month post-op nutrition visit for diet advancement per MD.

## 2016-03-16 ENCOUNTER — Encounter: Payer: Managed Care, Other (non HMO) | Attending: General Surgery | Admitting: Dietician

## 2016-03-16 DIAGNOSIS — K219 Gastro-esophageal reflux disease without esophagitis: Secondary | ICD-10-CM | POA: Diagnosis not present

## 2016-03-16 DIAGNOSIS — G4730 Sleep apnea, unspecified: Secondary | ICD-10-CM | POA: Insufficient documentation

## 2016-03-16 DIAGNOSIS — I10 Essential (primary) hypertension: Secondary | ICD-10-CM | POA: Diagnosis not present

## 2016-03-16 DIAGNOSIS — Z6841 Body Mass Index (BMI) 40.0 and over, adult: Secondary | ICD-10-CM | POA: Insufficient documentation

## 2016-03-16 DIAGNOSIS — M1990 Unspecified osteoarthritis, unspecified site: Secondary | ICD-10-CM | POA: Insufficient documentation

## 2016-03-16 DIAGNOSIS — M47812 Spondylosis without myelopathy or radiculopathy, cervical region: Secondary | ICD-10-CM | POA: Diagnosis present

## 2016-03-16 NOTE — Patient Instructions (Signed)
Goals:  Follow Phase 3B: High Protein + Non-Starchy Vegetables  Eat 3-6 small meals/snacks, every 3-5 hrs  Increase lean protein foods to meet 60g goal  Increase fluid intake to 64oz +  Avoid drinking 15 minutes before, during and 30 minutes after eating  Aim for >30 min of physical activity daily  Surgery date: 01/14/2016 Surgery type: RYGB [facility] Weight today: 215 lbs Weight change: 26.6 lbs Total weight lost: 42 lbs Weight loss goal: 140-145 lbs  TANITA  BODY COMP RESULTS  11/25/15 01/28/16 03/16/16   BMI (kg/m^2) 44 41.5 37   Fat Mass (lbs) 134 127.8 109.4   Fat Free Mass (lbs) 122.6 113.8 106.4   Total Body Water (lbs) 89.8 83.0 76.8

## 2016-03-16 NOTE — Progress Notes (Signed)
  Follow-up visit:  8 Weeks Post-Operative RYGB Surgery  Medical Nutrition Therapy:  Appt start time: 0800 end time:  0830.  Primary concerns today: Post-operative Bariatric Surgery Nutrition Management.  Ashley Hardy returns having lost 42 pounds. Feels like her blood pressure has been low. Stopped taking her blood pressure meds and has an appointment with her PCP soon. Tolerating most recommended foods and states she has adhered to current diet recommendations.   Surgery date: 01/14/2016 Surgery type: RYGB [facility] Weight today: 215 lbs Weight change: 26.6 lbs Total weight lost: 42 lbs Weight loss goal: 140-145 lbs  TANITA  BODY COMP RESULTS  11/25/15 01/28/16 03/16/16   BMI (kg/m^2) 44 41.5 37   Fat Mass (lbs) 134 127.8 109.4   Fat Free Mass (lbs) 122.6 113.8 106.4   Total Body Water (lbs) 89.8 83.0 76.8    Preferred Learning Style:  No preference indicated   Learning Readiness:   Ready  24-hr recall: B (7 AM): Premier protein shake (30g) Snk (11AM): string cheese (6g)  L (12:30-1PM): chili (12-14g) Snk (3PM): yogurt (15g)  D (7:30PM): deli meat (12-14g) Snk (PM):   Fluid intake: 51 oz water + 11 oz protein Estimated total protein intake: 75-80 g/day  Medications: see list Supplementation: taking  Average CBG per patient: 110 mg/dL in the morning Last patient reported A1c: none since surgery  Using straws: no Drinking while eating: no Hair loss: none Carbonated beverages: none N/V/D/C: constipation, takes Miralax daily Dumping syndrome: none  Recent physical activity:  Started BELT  Progress Towards Goal(s):  In progress.  Handouts given during visit include:  Phase 3B lean protein + non starchy vegetables   Nutritional Diagnosis:  [place]-3.3 Overweight/obesity related to past poor dietary habits and physical inactivity as evidenced by patient w/ recent RYGB surgery following dietary guidelines for continued weight loss.      Intervention:  Nutrition counseling provided.  Teaching Method Utilized:  Visual Auditory Hands on  Barriers to learning/adherence to lifestyle change: none  Demonstrated degree of understanding via:  Teach Back   Monitoring/Evaluation:  Dietary intake, exercise, and body weight. Follow up in 2 months for 4 month post-op visit.

## 2016-03-17 ENCOUNTER — Encounter: Payer: Self-pay | Admitting: Dietician

## 2016-05-18 ENCOUNTER — Ambulatory Visit: Payer: Self-pay | Admitting: Dietician

## 2016-11-16 ENCOUNTER — Encounter (HOSPITAL_COMMUNITY): Payer: Self-pay

## 2017-11-12 ENCOUNTER — Ambulatory Visit (HOSPITAL_COMMUNITY)
Admission: EM | Admit: 2017-11-12 | Discharge: 2017-11-12 | Discharge status: Absent without leave | Payer: Managed Care, Other (non HMO)

## 2018-04-06 ENCOUNTER — Other Ambulatory Visit: Payer: Self-pay

## 2018-04-06 ENCOUNTER — Emergency Department (HOSPITAL_COMMUNITY): Payer: 59

## 2018-04-06 ENCOUNTER — Emergency Department (HOSPITAL_COMMUNITY)
Admission: EM | Admit: 2018-04-06 | Discharge: 2018-04-06 | Disposition: A | Discharge status: Home / Self Care | Payer: 59 | Attending: Emergency Medicine | Admitting: Emergency Medicine

## 2018-04-06 ENCOUNTER — Encounter (HOSPITAL_COMMUNITY): Payer: Self-pay

## 2018-04-06 DIAGNOSIS — Z79899 Other long term (current) drug therapy: Secondary | ICD-10-CM | POA: Insufficient documentation

## 2018-04-06 DIAGNOSIS — E119 Type 2 diabetes mellitus without complications: Secondary | ICD-10-CM | POA: Diagnosis not present

## 2018-04-06 DIAGNOSIS — R791 Abnormal coagulation profile: Secondary | ICD-10-CM | POA: Insufficient documentation

## 2018-04-06 DIAGNOSIS — T7840XA Allergy, unspecified, initial encounter: Secondary | ICD-10-CM | POA: Insufficient documentation

## 2018-04-06 DIAGNOSIS — I10 Essential (primary) hypertension: Secondary | ICD-10-CM | POA: Insufficient documentation

## 2018-04-06 DIAGNOSIS — R55 Syncope and collapse: Secondary | ICD-10-CM | POA: Diagnosis not present

## 2018-04-06 LAB — CBC WITH DIFFERENTIAL/PLATELET
Abs Immature Granulocytes: 0.06 10*3/uL (ref 0.00–0.07)
BASOS PCT: 0 %
Basophils Absolute: 0 10*3/uL (ref 0.0–0.1)
EOS ABS: 0 10*3/uL (ref 0.0–0.5)
EOS PCT: 0 %
HCT: 42 % (ref 36.0–46.0)
Hemoglobin: 13.4 g/dL (ref 12.0–15.0)
Immature Granulocytes: 1 %
Lymphocytes Relative: 8 %
Lymphs Abs: 1 10*3/uL (ref 0.7–4.0)
MCH: 28.9 pg (ref 26.0–34.0)
MCHC: 31.9 g/dL (ref 30.0–36.0)
MCV: 90.5 fL (ref 80.0–100.0)
MONO ABS: 0.4 10*3/uL (ref 0.1–1.0)
MONOS PCT: 3 %
NEUTROS ABS: 10.3 10*3/uL — AB (ref 1.7–7.7)
Neutrophils Relative %: 88 %
PLATELETS: 376 10*3/uL (ref 150–400)
RBC: 4.64 MIL/uL (ref 3.87–5.11)
RDW: 12.4 % (ref 11.5–15.5)
WBC: 11.7 10*3/uL — AB (ref 4.0–10.5)
nRBC: 0 % (ref 0.0–0.2)

## 2018-04-06 LAB — BASIC METABOLIC PANEL
Anion gap: 10 (ref 5–15)
BUN: 19 mg/dL (ref 6–20)
CO2: 23 mmol/L (ref 22–32)
CREATININE: 1.01 mg/dL — AB (ref 0.44–1.00)
Calcium: 9 mg/dL (ref 8.9–10.3)
Chloride: 104 mmol/L (ref 98–111)
GLUCOSE: 149 mg/dL — AB (ref 70–99)
POTASSIUM: 4.2 mmol/L (ref 3.5–5.1)
SODIUM: 137 mmol/L (ref 135–145)

## 2018-04-06 LAB — I-STAT TROPONIN, ED
TROPONIN I, POC: 0 ng/mL (ref 0.00–0.08)
Troponin i, poc: 0 ng/mL (ref 0.00–0.08)

## 2018-04-06 LAB — D-DIMER, QUANTITATIVE: D-Dimer, Quant: 10.67 ug/mL-FEU — ABNORMAL HIGH (ref 0.00–0.50)

## 2018-04-06 MED ORDER — PREDNISONE 20 MG PO TABS: ORAL_TABLET | ORAL | 0 refills | Status: DC

## 2018-04-06 MED ORDER — FAMOTIDINE IN NACL 20-0.9 MG/50ML-% IV SOLN
20.0000 mg | Freq: Once | INTRAVENOUS | Status: AC
  Administered 2018-04-06: 20 mg via INTRAVENOUS
  Filled 2018-04-06: qty 50

## 2018-04-06 MED ORDER — SODIUM CHLORIDE 0.9 % IV BOLUS
1000.0000 mL | Freq: Once | INTRAVENOUS | Status: AC
Start: 2018-04-06 — End: 2018-04-06
  Administered 2018-04-06: 1000 mL via INTRAVENOUS

## 2018-04-06 MED ORDER — FAMOTIDINE 20 MG PO TABS: 20.0000 mg | ORAL_TABLET | Freq: Two times a day (BID) | ORAL | 0 refills | Status: AC

## 2018-04-06 MED ORDER — METHYLPREDNISOLONE SODIUM SUCC 125 MG IJ SOLR
125.0000 mg | Freq: Once | INTRAMUSCULAR | Status: AC
  Administered 2018-04-06: 125 mg via INTRAVENOUS
  Filled 2018-04-06: qty 2

## 2018-04-06 MED ORDER — IOPAMIDOL (ISOVUE-370) INJECTION 76%
INTRAVENOUS | Status: AC
  Administered 2018-04-06: 100 mL
  Filled 2018-04-06: qty 100

## 2018-04-06 NOTE — ED Notes (Signed)
Patient verbalizes understanding of discharge instructions. Opportunity for questioning and answers were provided. Armband removed by staff, pt discharged from ED ambulatory.   

## 2018-04-06 NOTE — ED Provider Notes (Addendum)
[facility] EMERGENCY DEPARTMENT Provider Note   CSN: [id number] Arrival date & time: 04/06/18  0141     History   Chief Complaint Chief Complaint  Patient presents with  . Allergic Reaction  . Loss of Consciousness    HPI Ashley Hardy is a 63 y.o. female.  The history is provided by the patient and the EMS personnel.  Allergic Reaction  Presenting symptoms: rash   Presenting symptoms: no difficulty breathing, no difficulty swallowing, no swelling and no wheezing   Severity:  Mild Duration:  1 day Prior allergic episodes:  No prior episodes Context: medications   Context comment:  Amoxicillin Relieved by:  Nothing Worsened by:  Nothing Ineffective treatments:  None tried Loss of Consciousness  Episode history:  Multiple Most recent episode:  Today Timing:  Rare Progression:  Resolved Chronicity:  New Context: standing up and urination   Context: not blood draw   Witnessed: no   Relieved by:  Nothing Worsened by:  Nothing Ineffective treatments:  None tried Associated symptoms: no chest pain, no confusion, no diaphoresis, no difficulty breathing, no dizziness, no fever, no focal sensory loss, no headaches, no nausea, no palpitations, no shortness of breath, no vomiting and no weakness   Risk factors: no congenital heart disease   Patient who was started on amoxicillin for 3 days of facial pain without fever or headache and completed the course of medication on Saturday presents with hives that started on her back on Monday.  She took benadryl without complete resolution of itching. Prior to arrival, patient reports getting up to urinate and was itchy and then passed out in the bathroom x 3 post urination.  Still itching.  Patient denies headache, weakness, numbness, neck pain, changes of vision or speech or cognition.  She denies chest pain, back pain, abdominal pain, shortness of breath, sore throat, facial swelling. 50 of benadryl given by EMS.   No CP, no DOE, no SOB, no n/v/d. No swelling of the face tongue or mouth. She states she is off all medication since her surgery and only drinks 2-4 cups of water a day. No further facial pain.  No rectal bleeding. No leg pain or swelling, no long car trips or plane trips.  Denies hitting head and remembers episodes, feeling lightheaded.    Past Medical History:  Diagnosis Date  . Arthritis   . Diabetes mellitus without complication (HCC)   . Heartburn   . Hypertension   . Sleep apnea     Patient Active Problem List   Diagnosis Date Noted  . Prediabetes 01/14/2016  . GERD (gastroesophageal reflux disease) 01/14/2016  . Osteoarthritis of right knee 01/14/2016  . OSA (obstructive sleep apnea) 11/29/2015  . Morbid (severe) obesity due to excess calories (HCC) 11/29/2015  . Essential hypertension 11/29/2015    Past Surgical History:  Procedure Laterality Date  . ABDOMINAL HYSTERECTOMY     with removal 1 ovary  . GASTRIC ROUX-EN-Y N/A 01/14/2016   Procedure: LAPAROSCOPIC ROUX-EN-Y GASTRIC BYPASS WITH UPPER ENDOSCOPY;  Surgeon: Gaynelle Adu, MD;  Location: [facility];  Service: General;  Laterality: N/A;  . TONSILLECTOMY    . UPPER GI ENDOSCOPY  01/14/2016   Procedure: UPPER GI ENDOSCOPY;  Surgeon: Gaynelle Adu, MD;  Location: [facility];  Service: General;;     OB History   No obstetric history on file.      Home Medications    Prior to Admission medications   Medication Sig Start Date End Date  Taking? Authorizing Provider  oxyCODONE (ROXICODONE) 5 MG/5ML solution Take 5-10 mLs (5-10 mg total) by mouth every 4 (four) hours as needed for moderate pain or severe pain. 01/16/16   Gaynelle Adu, MD  pravastatin (PRAVACHOL) 40 MG tablet Take 40 mg by mouth daily.    [provider]  valsartan (DIOVAN) 160 MG tablet Take 1 tablet (160 mg total) by mouth daily. 11/28/15   Nyoka Cowden, MD    Family History Family History  Problem Relation Age of Onset  . Arthritis Mother   .  Hypertension Mother   . Alcohol abuse Father   . Cancer Father     Social History Social History   Tobacco Use  . Smoking status: Never Smoker  . Smokeless tobacco: Never Used  Substance Use Topics  . Alcohol use: No  . Drug use: No     Allergies   Amoxicillin-pot clavulanate   Review of Systems Review of Systems  Constitutional: Negative for diaphoresis, fatigue and fever.  HENT: Negative for congestion, drooling, facial swelling, sinus pressure, sinus pain, sore throat and trouble swallowing.   Eyes: Negative for photophobia and visual disturbance.  Respiratory: Negative for apnea, cough, chest tightness, shortness of breath, wheezing and stridor.   Cardiovascular: Positive for syncope. Negative for chest pain, palpitations and leg swelling.  Gastrointestinal: Negative for abdominal pain, anal bleeding, nausea and vomiting.  Genitourinary: Negative for dysuria and flank pain.  Musculoskeletal: Negative for back pain.  Skin: Positive for rash.  Neurological: Positive for syncope. Negative for dizziness, facial asymmetry, speech difficulty, weakness, numbness and headaches.  Psychiatric/Behavioral: Negative for confusion and decreased concentration.  All other systems reviewed and are negative.    Physical Exam Updated Vital Signs BP 116/68 (BP Location: Right Arm)   Pulse 92   Temp 99 F (37.2 C) (Oral)   Resp 20   Ht 5\' 3"  (1.6 m)   Wt 84.8 kg   SpO2 97%   BMI 33.13 kg/m   Physical Exam Constitutional:      Appearance: Normal appearance. She is not ill-appearing or diaphoretic.  HENT:     Head: Normocephalic and atraumatic.     Comments: No swelling of the lips tongue or uvula    Nose: Nose normal.     Mouth/Throat:     Mouth: Mucous membranes are moist.  Eyes:     Extraocular Movements: Extraocular movements intact.     Conjunctiva/sclera: Conjunctivae normal.     Pupils: Pupils are equal, round, and reactive to light.     Comments: No proptosis    Neck:     Musculoskeletal: Normal range of motion and neck supple.  Cardiovascular:     Rate and Rhythm: Normal rate and regular rhythm.     Pulses: Normal pulses.     Heart sounds: Normal heart sounds.  Pulmonary:     Effort: Pulmonary effort is normal.     Breath sounds: Normal breath sounds.  Abdominal:     General: Abdomen is flat. Bowel sounds are normal.     Tenderness: There is no abdominal tenderness. There is no guarding or rebound.     Hernia: No hernia is present.     Comments: No bruits no pulsations  Musculoskeletal: Normal range of motion.        General: No swelling or tenderness.     Right lower leg: No edema.     Left lower leg: No edema.  Skin:    General: Skin is warm and dry.  Capillary Refill: Capillary refill takes less than 2 seconds.     Findings: No rash.  Neurological:     General: No focal deficit present.     Mental Status: She is alert and oriented to person, place, and time.     Cranial Nerves: No cranial nerve deficit.     Motor: No weakness.     Deep Tendon Reflexes: Reflexes normal.  Psychiatric:        Mood and Affect: Mood normal.        Behavior: Behavior normal.      ED Treatments / Results  Labs (all labs ordered are listed, but only abnormal results are displayed) Results for orders placed or performed during the hospital encounter of 04/06/18  CBC with Differential/Platelet  Result Value Ref Range   WBC 11.7 (H) 4.0 - 10.5 K/uL   RBC 4.64 3.87 - 5.11 MIL/uL   Hemoglobin 13.4 12.0 - 15.0 g/dL   HCT 82.942.0 56.236.0 - 13.046.0 %   MCV 90.5 80.0 - 100.0 fL   MCH 28.9 26.0 - 34.0 pg   MCHC 31.9 30.0 - 36.0 g/dL   RDW 86.512.4 78.411.5 - 69.615.5 %   Platelets 376 150 - 400 K/uL   nRBC 0.0 0.0 - 0.2 %   Neutrophils Relative % 88 %   Neutro Abs 10.3 (H) 1.7 - 7.7 K/uL   Lymphocytes Relative 8 %   Lymphs Abs 1.0 0.7 - 4.0 K/uL   Monocytes Relative 3 %   Monocytes Absolute 0.4 0.1 - 1.0 K/uL   Eosinophils Relative 0 %   Eosinophils Absolute 0.0  0.0 - 0.5 K/uL   Basophils Relative 0 %   Basophils Absolute 0.0 0.0 - 0.1 K/uL   Immature Granulocytes 1 %   Abs Immature Granulocytes 0.06 0.00 - 0.07 K/uL  Basic metabolic panel  Result Value Ref Range   Sodium 137 135 - 145 mmol/L   Potassium 4.2 3.5 - 5.1 mmol/L   Chloride 104 98 - 111 mmol/L   CO2 23 22 - 32 mmol/L   Glucose, Bld 149 (H) 70 - 99 mg/dL   BUN 19 6 - 20 mg/dL   Creatinine, Ser 2.951.01 (H) 0.44 - 1.00 mg/dL   Calcium 9.0 8.9 - 28.410.3 mg/dL   GFR calc non Af Amer NOT CALCULATED >60 mL/min   GFR calc Af Amer NOT CALCULATED >60 mL/min   Anion gap 10 5 - 15  D-dimer, quantitative (not [facility])  Result Value Ref Range   D-Dimer, Quant 10.67 (H) 0.00 - 0.50 ug/mL-FEU  I-stat troponin, ED  Result Value Ref Range   Troponin i, poc 0.00 0.00 - 0.08 ng/mL   Comment 3          I-stat troponin, ED  Result Value Ref Range   Troponin i, poc 0.00 0.00 - 0.08 ng/mL   Comment 3           Dg Chest 2 View  Result Date: 04/06/2018 CLINICAL DATA:  Syncope. EXAM: CHEST - 2 VIEW COMPARISON:  02/29/2004 FINDINGS: The cardiomediastinal contours are normal. The lungs are clear. Pulmonary vasculature is normal. No consolidation, pleural effusion, or pneumothorax. No acute osseous abnormalities are seen. Mild degenerative change in the spine. IMPRESSION: No acute chest findings. Electronically Signed   By: Narda RutherfordMelanie  Sanford M.D.   On: 04/06/2018 02:19   Ct Angio Chest Pe W And/or Wo Contrast  Result Date: 04/06/2018 CLINICAL DATA:  Multiple syncopal episodes tonight. Elevated D-dimer. EXAM: CT ANGIOGRAPHY CHEST  WITH CONTRAST TECHNIQUE: Multidetector CT imaging of the chest was performed using the standard protocol during bolus administration of intravenous contrast. Multiplanar CT image reconstructions and MIPs were obtained to evaluate the vascular anatomy. CONTRAST:  ISOVUE-370 IOPAMIDOL (ISOVUE-370) INJECTION 76% COMPARISON:  Chest x-ray dated 04/06/2018 FINDINGS: Cardiovascular:  Satisfactory opacification of the pulmonary arteries to the segmental level. No evidence of pulmonary embolism. Normal heart size. No pericardial effusion. Mediastinum/Nodes: No enlarged mediastinal, hilar, or axillary lymph nodes. Thyroid gland, trachea, and esophagus demonstrate no significant findings. Lungs/Pleura: There is a 2.7 mm nodule in the right upper lobe on image 51 of series 6. The lungs are otherwise clear. No effusions. No infiltrates. Minimal atelectasis at the right lung base laterally. Upper Abdomen: No acute abnormality. Evidence of previous gastric surgery. Musculoskeletal: No acute abnormality. 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 Review of the MIP images confirms the above findings. IMPRESSION: 1. No evidence of pulmonary emboli or other acute abnormality. 2. Single 2.7 mm nodule in the right upper lobe. No follow-up needed if patient is low-risk. Non-contrast chest CT can be considered in 12 months if patient is high-risk. This recommendation follows the consensus statement: Guidelines for Management of Incidental Pulmonary Nodules Detected on CT Images: From the Fleischner Society 2017; Radiology 2017; 284:228-243. Electronically Signed   By: Francene Boyers M.D.   On: 04/06/2018 06:17    EKG EKG Interpretation  Date/Time:  Wednesday April 06 2018 01:53:03 EST Ventricular Rate:  89 PR Interval:    QRS Duration: 77 QT Interval:  342 QTC Calculation: 417 R Axis:   52 Text Interpretation:  Sinus rhythm Consider right atrial enlargement Confirmed by Aislin Onofre ([id number]) on 04/06/2018 2:33:18 AM   Radiology Dg Chest 2 View  Result Date: 04/06/2018 CLINICAL DATA:  Syncope. EXAM: CHEST - 2 VIEW COMPARISON:  02/29/2004 FINDINGS: The cardiomediastinal contours are normal. The lungs are clear. Pulmonary vasculature is normal. No consolidation, pleural effusion, or pneumothorax. No acute osseous abnormalities are seen. Mild degenerative change in  the spine. IMPRESSION: No acute chest findings. Electronically Signed   By: Narda Rutherford M.D.   On: 04/06/2018 02:19    Procedures Procedures (including critical care time)  Medications Ordered in ED Medications  sodium chloride 0.9 % bolus 1,000 mL (has no administration in time range)  famotidine (PEPCID) IVPB 20 mg premix (has no administration in time range)  methylPREDNISolone sodium succinate (SOLU-MEDROL) 125 mg/2 mL injection 125 mg (has no administration in time range)    Patient has no hives in the ED no swelling of the lips tongue or uvula. She was mildly orthostatic in the ED. Patient denies chest pain, shortness of breath, back pain, shoulder pain and abdominal pain. Denies bleeding.  She has not headache or signs of intracranial bleed or thrombosis and is denying all symptoms of same.  She was ruled out for MI in the ED and has a heart score of one and is very low risk for MACE.   Ruled out for a PE in the ED with a negative CT angio.  Mildly orthostatic in the ED and IVF was given.  I am not sure she completely passed out as she states she did not hit her head and recalls what happened.  She is denying all pain complaints.  I do not believe she had anaphylaxis as benadryl alone would not have reversed her symptoms.  I do not believe she is drinking enough water for  a post gastric bypass patient and have instructed her to increase water intake.  I have instructed her to follow up within 48 hours with her PMD.   Final Clinical Impressions(s) / ED Diagnoses   Still having mild itching but no hives no swelling of the lips tongue or uvula. Will treat with prednisone and pepcid in addition to benadryl.  Strict return precautions for further events, chest pain, back pain, abdominal pain shortness of breath, weakness, changes in vision speech or cognition.  Patient verbalizes understanding and agrees to follow up.    Return for pain, intractable cough, productive cough,fevers >100.4  unrelieved by medication, shortness of breath, intractable vomiting, or diarrhea, abdominal pain, passing out, Inability to tolerate liquids or food, cough, altered mental status or any concerns. No signs of systemic illness or infection. The patient is nontoxic-appearing on exam and vital signs are within normal limits.   I have reviewed the triage vital signs and the nursing notes. Pertinent labs &imaging results that were available during my care of the patient were reviewed by me and considered in my medical decision making (see chart for details).  After history, exam, and medical workup I feel the patient has been appropriately medically screened and is safe for discharge home. Pertinent diagnoses were discussed with the patient. Patient was given return precautions.    Raykwon Hobbs, MD 04/06/18 [id number]    Cy Blamer, MD 04/06/18 [id number]

## 2018-04-06 NOTE — ED Notes (Signed)
Pt.dressed in a gown   on monitor

## 2018-04-06 NOTE — ED Notes (Signed)
Patient transported to X-ray 

## 2018-04-06 NOTE — ED Triage Notes (Signed)
Per [facility], pt from home w/ a c/o an allergic reaction after taking amoxicillin and 3 syncopal episodes. Pt finished her antibiotic on Saturday and began experiencing hives and itching yesterday. Today she had the syncopal episodes. No complaints of pain. No injuries reported.   50 mg of Benadryl

## 2018-04-21 ENCOUNTER — Other Ambulatory Visit: Payer: Self-pay | Admitting: Nurse Practitioner

## 2018-04-21 DIAGNOSIS — Z13820 Encounter for screening for osteoporosis: Secondary | ICD-10-CM

## 2018-04-21 DIAGNOSIS — Z1231 Encounter for screening mammogram for malignant neoplasm of breast: Secondary | ICD-10-CM

## 2018-06-20 ENCOUNTER — Ambulatory Visit: Payer: Self-pay

## 2018-06-20 ENCOUNTER — Other Ambulatory Visit: Payer: Self-pay

## 2018-08-15 ENCOUNTER — Other Ambulatory Visit: Payer: Self-pay

## 2018-08-15 ENCOUNTER — Inpatient Hospital Stay: Admission: RE | Admit: 2018-08-15 | Payer: Self-pay | Source: Ambulatory Visit

## 2018-11-23 ENCOUNTER — Encounter (HOSPITAL_COMMUNITY): Payer: Self-pay

## 2019-08-04 ENCOUNTER — Encounter (HOSPITAL_COMMUNITY): Payer: Self-pay

## 2020-08-02 ENCOUNTER — Encounter (HOSPITAL_COMMUNITY): Payer: Self-pay | Admitting: *Deleted

## 2021-07-25 ENCOUNTER — Other Ambulatory Visit: Payer: Self-pay

## 2021-07-25 DIAGNOSIS — R1084 Generalized abdominal pain: Secondary | ICD-10-CM | POA: Insufficient documentation

## 2021-07-25 DIAGNOSIS — E119 Type 2 diabetes mellitus without complications: Secondary | ICD-10-CM | POA: Diagnosis not present

## 2021-07-25 DIAGNOSIS — R109 Unspecified abdominal pain: Secondary | ICD-10-CM | POA: Diagnosis present

## 2021-07-25 DIAGNOSIS — I10 Essential (primary) hypertension: Secondary | ICD-10-CM | POA: Diagnosis not present

## 2021-07-25 LAB — URINALYSIS, ROUTINE W REFLEX MICROSCOPIC
Bilirubin Urine: NEGATIVE
Glucose, UA: NEGATIVE mg/dL
Hgb urine dipstick: NEGATIVE
Ketones, ur: NEGATIVE mg/dL
Nitrite: NEGATIVE
Protein, ur: NEGATIVE mg/dL
Specific Gravity, Urine: 1.018 (ref 1.005–1.030)
WBC, UA: >50 WBC/hpf — ABNORMAL HIGH (ref 0–5)
pH: 5 (ref 5.0–8.0)

## 2021-07-25 LAB — COMPREHENSIVE METABOLIC PANEL
ALT: 20 U/L (ref 0–44)
AST: 23 U/L (ref 15–41)
Albumin: 4.1 g/dL (ref 3.5–5.0)
Alkaline Phosphatase: 79 U/L (ref 38–126)
Anion gap: 10 (ref 5–15)
BUN: 19 mg/dL (ref 8–23)
CO2: 24 mmol/L (ref 22–32)
Calcium: 8.9 mg/dL (ref 8.9–10.3)
Chloride: 104 mmol/L (ref 98–111)
Creatinine, Ser: 1.26 mg/dL — ABNORMAL HIGH (ref 0.44–1.00)
GFR, Estimated: 48 mL/min — ABNORMAL LOW (ref >60)
Glucose, Bld: 101 mg/dL — ABNORMAL HIGH (ref 70–99)
Potassium: 3.8 mmol/L (ref 3.5–5.1)
Sodium: 138 mmol/L (ref 135–145)
Total Bilirubin: 1 mg/dL (ref 0.3–1.2)
Total Protein: 7.8 g/dL (ref 6.5–8.1)

## 2021-07-25 LAB — CBC
HCT: 43.9 % (ref 36.0–46.0)
Hemoglobin: 13.9 g/dL (ref 12.0–15.0)
MCH: 28.2 pg (ref 26.0–34.0)
MCHC: 31.7 g/dL (ref 30.0–36.0)
MCV: 89 fL (ref 80.0–100.0)
Platelets: 366 10*3/uL (ref 150–400)
RBC: 4.93 MIL/uL (ref 3.87–5.11)
RDW: 12.4 % (ref 11.5–15.5)
WBC: 4.5 10*3/uL (ref 4.0–10.5)
nRBC: 0 % (ref 0.0–0.2)

## 2021-07-25 LAB — LIPASE, BLOOD: Lipase: 38 U/L (ref 11–51)

## 2021-07-25 NOTE — ED Triage Notes (Signed)
Pt presents to ER from home c/o upper abd pain that started yesterday and has been nagging her all day today.  Pt states pain feels like something is "gnawing away at my stomach."  Pt denies emesis or diarrhea.  States she is having some nausea and what feels like constipation.  Pt states last BM was yesterday and was normal.  Denies hx of bowel obstructions or surgeries.  Pt A&O x4 at this time in NAD.

## 2021-07-26 ENCOUNTER — Emergency Department: Payer: 59

## 2021-07-26 ENCOUNTER — Emergency Department
Admission: EM | Admit: 2021-07-26 | Discharge: 2021-07-26 | Disposition: A | Discharge status: Home / Self Care | Payer: 59 | Attending: Emergency Medicine | Admitting: Emergency Medicine

## 2021-07-26 DIAGNOSIS — R1084 Generalized abdominal pain: Secondary | ICD-10-CM

## 2021-07-26 MED ORDER — IOHEXOL 300 MG/ML  SOLN
100.0000 mL | Freq: Once | INTRAMUSCULAR | Status: AC | PRN
  Administered 2021-07-26: 100 mL via INTRAVENOUS

## 2021-07-26 MED ORDER — ONDANSETRON HCL 4 MG/2ML IJ SOLN
4.0000 mg | Freq: Once | INTRAMUSCULAR | Status: AC
  Administered 2021-07-26: 4 mg via INTRAVENOUS
  Filled 2021-07-26: qty 2

## 2021-07-26 MED ORDER — FENTANYL CITRATE PF 50 MCG/ML IJ SOSY
50.0000 ug | PREFILLED_SYRINGE | Freq: Once | INTRAMUSCULAR | Status: AC
  Administered 2021-07-26: 50 ug via INTRAVENOUS
  Filled 2021-07-26: qty 1

## 2021-07-26 NOTE — ED Provider Notes (Signed)
[facility] Provider Note    Event Date/Time   First MD Initiated Contact with Patient 07/26/21 [phone]     (approximate)   History   Abdominal Pain   HPI  Ashley Hardy is a 66 y.o. female with a history of a Roux-en-Y gastric bypass, hysterectomy, diabetes, hypertension, GERD who presents for evaluation of abdominal pain.  Patient reports that the pain started yesterday and has been intermittent throughout the day.  She feels like there is something gnawing in her gut almost like if she was hungry even after she eats. She initially felt it was gas pain she started to become more concerned because she has been unable to pass gas throughout the day today.  She has had no nausea or vomiting, her last BM was yesterday.  No constipation, no diarrhea, no chest pain or shortness of breath.  She has never had small bowel obstruction in the past.  No dysuria or hematuria.  She reports a similar episode of pain last week     Past Medical History:  Diagnosis Date   Arthritis    Diabetes mellitus without complication ([place])    Heartburn    Hypertension    Sleep apnea     Past Surgical History:  Procedure Laterality Date   ABDOMINAL HYSTERECTOMY     with removal 1 ovary   GASTRIC ROUX-EN-Y N/A 01/14/2016   Procedure: LAPAROSCOPIC ROUX-EN-Y GASTRIC BYPASS WITH UPPER ENDOSCOPY;  Surgeon: Greer Pickerel, MD;  Location: [facility];  Service: General;  Laterality: N/A;   TONSILLECTOMY     UPPER GI ENDOSCOPY  01/14/2016   Procedure: UPPER GI ENDOSCOPY;  Surgeon: Greer Pickerel, MD;  Location: [facility];  Service: General;;     Physical Exam   Triage Vital Signs: ED Triage Vitals  Enc Vitals Group     BP 07/25/21 2150 137/88     Pulse Rate 07/25/21 2150 81     Resp 07/25/21 2150 16     Temp 07/25/21 2150 99.3 F (37.4 C)     Temp Source 07/25/21 2150 Oral     SpO2 07/25/21 2150 96 %     Weight 07/25/21 2151 200 lb (90.7 kg)     Height 07/25/21 2151 5\' 3"  (1.6 m)      Head Circumference --      Peak Flow --      Pain Score 07/25/21 2150 8     Pain Loc --      Pain Edu? --      Excl. in [place]? --     Most recent vital signs: Vitals:   07/26/21 0218 07/26/21 0300  BP: 123/73 120/74  Pulse:    Resp: 15 12  Temp:    SpO2:       Constitutional: Alert and oriented. Well appearing and in no apparent distress. HEENT:      Head: Normocephalic and atraumatic.         Eyes: Conjunctivae are normal. Sclera is non-icteric.       Mouth/Throat: Mucous membranes are moist.       Neck: Supple with no signs of meningismus. Cardiovascular: Regular rate and rhythm. No murmurs, gallops, or rubs. 2+ symmetrical distal pulses are present in all extremities.  Respiratory: Normal respiratory effort. Lungs are clear to auscultation bilaterally.  Gastrointestinal: Soft, mild diffuse tenderness, and non distended with positive bowel sounds. No rebound or guarding. Genitourinary: No CVA tenderness. Musculoskeletal:  No edema, cyanosis, or erythema of extremities. Neurologic:  Normal speech and language. Face is symmetric. Moving all extremities. No gross focal neurologic deficits are appreciated. Skin: Skin is warm, dry and intact. No rash noted. Psychiatric: Mood and affect are normal. Speech and behavior are normal.  ED Results / Procedures / Treatments   Labs (all labs ordered are listed, but only abnormal results are displayed) Labs Reviewed  COMPREHENSIVE METABOLIC PANEL - Abnormal; Notable for the following components:      Result Value   Glucose, Bld 101 (*)    Creatinine, Ser 1.26 (*)    GFR, Estimated 48 (*)    All other components within normal limits  URINALYSIS, ROUTINE W REFLEX MICROSCOPIC - Abnormal; Notable for the following components:   Color, Urine YELLOW (*)    APPearance CLOUDY (*)    Leukocytes,Ua LARGE (*)    WBC, UA >50 (*)    Bacteria, UA RARE (*)    All other components within normal limits  URINE CULTURE  LIPASE, BLOOD  CBC      EKG  ED ECG REPORT I, Rudene Re, the attending physician, personally viewed and interpreted this ECG.  Sinus rhythm, rate 79, normal intervals, normal axis, no STE r depression  RADIOLOGY I, Rudene Re, attending MD, have personally viewed and interpreted the images obtained during this visit as below:  CT a/p negative   ___________________________________________________ Interpretation by Radiologist:  CT ABDOMEN PELVIS W CONTRAST  Result Date: 07/26/2021 CLINICAL DATA:  Abdominal pain. EXAM: CT ABDOMEN AND PELVIS WITH CONTRAST TECHNIQUE: Multidetector CT imaging of the abdomen and pelvis was performed using the standard protocol following bolus administration of intravenous contrast. RADIATION DOSE REDUCTION: This exam was performed according to the departmental dose-optimization program which includes automated exposure control, adjustment of the mA and/or kV according to patient size and/or use of iterative reconstruction technique. CONTRAST:  168mL OMNIPAQUE IOHEXOL 300 MG/ML  SOLN COMPARISON:  None Available. FINDINGS: Evaluation of this exam is limited due to respiratory motion artifact. Lower chest: Right lung base linear atelectasis/scarring. The visualized lung bases are otherwise clear. No intra-abdominal free air air or free fluid. Hepatobiliary: No focal liver abnormality is seen. No gallstones, gallbladder wall thickening, or biliary dilatation. Pancreas: Unremarkable. No pancreatic ductal dilatation or surrounding inflammatory changes. Spleen: Normal in size without focal abnormality. Adrenals/Urinary Tract: The adrenal glands unremarkable. There is no hydronephrosis on either side. There is symmetric enhancement and excretion of contrast by both kidneys. Subcentimeter left renal upper pole hypodense lesion is too small to characterize. The visualized ureters and urinary bladder appear unremarkable. Stomach/Bowel: Postsurgical changes of Roux-en-Y gastric  bypass. There is moderate stool throughout the colon. There is no bowel obstruction or active inflammation. The appendix is normal. Vascular/Lymphatic: The abdominal aorta and IVC are unremarkable. No portal venous gas. There is no adenopathy. Reproductive: Hysterectomy.  No adnexal masses. Other: None Musculoskeletal: Lower lumbar facet arthropathy. No acute osseous pathology. IMPRESSION: 1. No acute intra-abdominal or pelvic pathology. 2. Postsurgical changes of Roux-en-Y gastric bypass. No bowel obstruction. Normal appendix. Electronically Signed   By: Anner Crete M.D.   On: 07/26/2021 02:13       PROCEDURES:  Critical Care performed: No  Procedures    IMPRESSION / MDM / ASSESSMENT AND PLAN / ED COURSE  I reviewed the triage vital signs and the nursing notes.  66 y.o. female with a history of a Roux-en-Y gastric bypass, hysterectomy, diabetes, hypertension, GERD who presents for evaluation of abdominal pain and decrease flatus.  Patient is well-appearing in no distress  with normal vital signs, abdomen is nondistended, soft mild diffuse tenderness but no localized tenderness, no rebound or guarding, positive bowel sounds.   Ddx: SBO, constipation, ulcer, dehiscence, diverticulitis, GERD, UTI, kidney stone, gallbladder pathology, pancreatitis   Plan: EKG is within normal limits.  Lipase and LFTs are within normal limits.   MEDICATIONS GIVEN IN ED: Medications  fentaNYL (SUBLIMAZE) injection 50 mcg (50 mcg Intravenous Given 07/26/21 0125)  ondansetron (ZOFRAN) injection 4 mg (4 mg Intravenous Given 07/26/21 0125)  iohexol (OMNIPAQUE) 300 MG/ML solution 100 mL (100 mLs Intravenous Contrast Given 07/26/21 0149)     ED COURSE: EKG within normal limits.  Metabolic panel with no significant abnormalities, no electrolyte derangements, no leukocytosis, no anemia.  UA with rare bacteria and greater than 50 WBCs.  Patient denies any symptoms of a urinary tract infection.  Urine was sent for  culture but no antibiotics were given.  CT abdomen pelvis was negative for any acute pathology.  Patient's pain resolved after fentanyl and Zofran and she was feeling markedly improved therefore with negative work-up and symptoms that had resolved patient was stable for discharge home with follow-up with primary care doctor.  Discussed my standard return precautions.   Consults: None   EMR reviewed including last visit with her primary care doctor for hypertension and diabetes from a month ago    FINAL CLINICAL IMPRESSION(S) / ED DIAGNOSES   Final diagnoses:  Generalized abdominal pain     Rx / DC Orders   ED Discharge Orders     None        Note:  This document was prepared using Dragon voice recognition software and may include unintentional dictation errors.   Please note:  Patient was evaluated in Emergency Department today for the symptoms described in the history of present illness. Patient was evaluated in the context of the global COVID-19 pandemic, which necessitated consideration that the patient might be at risk for infection with the SARS-CoV-2 virus that causes COVID-19. Institutional protocols and algorithms that pertain to the evaluation of patients at risk for COVID-19 are in a state of rapid change based on information released by regulatory bodies including the CDC and federal and state organizations. These policies and algorithms were followed during the patient's care in the ED.  Some ED evaluations and interventions may be delayed as a result of limited staffing during the pandemic.       Alfred Levins, [place], MD 07/26/21 [phone]

## 2021-07-26 NOTE — Discharge Instructions (Signed)

## 2021-07-27 LAB — URINE CULTURE

## 2021-08-08 ENCOUNTER — Encounter (HOSPITAL_COMMUNITY): Payer: Self-pay | Admitting: *Deleted

## 2022-08-18 ENCOUNTER — Encounter (HOSPITAL_COMMUNITY): Payer: Self-pay | Admitting: *Deleted

## 2022-10-17 ENCOUNTER — Other Ambulatory Visit: Payer: Self-pay

## 2022-10-17 ENCOUNTER — Emergency Department
Admission: EM | Admit: 2022-10-17 | Discharge: 2022-10-17 | Disposition: A | Discharge status: Home / Self Care | Payer: 59 | Source: Home / Self Care | Attending: Emergency Medicine | Admitting: Emergency Medicine

## 2022-10-17 ENCOUNTER — Emergency Department: Payer: 59

## 2022-10-17 DIAGNOSIS — I10 Essential (primary) hypertension: Secondary | ICD-10-CM | POA: Insufficient documentation

## 2022-10-17 DIAGNOSIS — W010XXA Fall on same level from slipping, tripping and stumbling without subsequent striking against object, initial encounter: Secondary | ICD-10-CM | POA: Diagnosis not present

## 2022-10-17 DIAGNOSIS — S6992XA Unspecified injury of left wrist, hand and finger(s), initial encounter: Secondary | ICD-10-CM | POA: Diagnosis present

## 2022-10-17 DIAGNOSIS — Y9259 Other trade areas as the place of occurrence of the external cause: Secondary | ICD-10-CM | POA: Insufficient documentation

## 2022-10-17 DIAGNOSIS — S52572A Other intraarticular fracture of lower end of left radius, initial encounter for closed fracture: Secondary | ICD-10-CM | POA: Insufficient documentation

## 2022-10-17 MED ORDER — NAPROXEN 500 MG PO TABS
500.0000 mg | ORAL_TABLET | Freq: Once | ORAL | Status: AC
  Administered 2022-10-17: 500 mg via ORAL
  Filled 2022-10-17: qty 1

## 2022-10-17 MED ORDER — NAPROXEN 500 MG PO TABS: 500.0000 mg | ORAL_TABLET | Freq: Two times a day (BID) | ORAL | 0 refills | Status: AC

## 2022-10-17 NOTE — ED Provider Notes (Signed)
[facility] Provider Note    Event Date/Time   First MD Initiated Contact with Patient 10/17/22 [phone]     (approximate)   History   Arm Injury   HPI  Ashley Hardy is a 67 y.o. female who presents today for evaluation of left wrist pain after she tripped over a suitcase and fell backwards while she was at a hotel >24 hours ago.  She has been wearing a splint that the EMS team put on her yesterday.  She denies any other injuries.  No head strike or LOC.  No paresthesias.  Patient Active Problem List   Diagnosis Date Noted   Prediabetes 01/14/2016   GERD (gastroesophageal reflux disease) 01/14/2016   Osteoarthritis of right knee 01/14/2016   OSA (obstructive sleep apnea) 11/29/2015   Morbid (severe) obesity due to excess calories (HCC) 11/29/2015   Essential hypertension 11/29/2015          Physical Exam   Triage Vital Signs: ED Triage Vitals [10/17/22 0647]  Encounter Vitals Group     BP (!) 150/91     Systolic BP Percentile      Diastolic BP Percentile      Pulse Rate 86     Resp 18     Temp 98.5 F (36.9 C)     Temp Source Oral     SpO2 100 %     Weight 190 lb (86.2 kg)     Height 5\' 4"  (1.626 m)     Head Circumference      Peak Flow      Pain Score 8     Pain Loc      Pain Education      Exclude from Growth Chart     Most recent vital signs: Vitals:   10/17/22 0647  BP: (!) 150/91  Pulse: 86  Resp: 18  Temp: 98.5 F (36.9 C)  SpO2: 100%    Physical Exam Vitals and nursing note reviewed.  Constitutional:      General: Awake and alert. No acute distress.    Appearance: Normal appearance. The patient is normal weight.  HENT:     Head: Normocephalic and atraumatic.     Mouth: Mucous membranes are moist.  Eyes:     General: PERRL. Normal EOMs        Right eye: No discharge.        Left eye: No discharge.     Conjunctiva/sclera: Conjunctivae normal.  Cardiovascular:     Rate and Rhythm: Normal rate and regular  rhythm.     Pulses: Normal pulses.  Pulmonary:     Effort: Pulmonary effort is normal. No respiratory distress.     Breath sounds: Normal breath sounds.  Abdominal:     Abdomen is soft. There is no abdominal tenderness. No rebound or guarding. No distention. Musculoskeletal:        General: No swelling. Normal range of motion.     Cervical back: Normal range of motion and neck supple.  Left arm: Sam splint and Coban in place.  When splint removed, no obvious deformity.  Normal range of motion of fingers, normal grip strength.  Normal radial pulse.  Compartment soft compressible throughout.  No open wounds.  Sensation intact light touch. Skin:    General: Skin is warm and dry.     Capillary Refill: Capillary refill takes less than 2 seconds.     Findings: No rash.  Neurological:     Mental Status:  The patient is awake and alert.      ED Results / Procedures / Treatments   Labs (all labs ordered are listed, but only abnormal results are displayed) Labs Reviewed - No data to display   EKG     RADIOLOGY I independently reviewed and interpreted imaging and agree with radiologists findings.     PROCEDURES:  Critical Care performed:   Procedures   MEDICATIONS ORDERED IN ED: Medications  naproxen (NAPROSYN) tablet 500 mg (500 mg Oral Given 10/17/22 0816)     IMPRESSION / MDM / ASSESSMENT AND PLAN / ED COURSE  I reviewed the triage vital signs and the nursing notes.   Differential diagnosis includes, but is not limited to, wrist fracture, dislocation, contusion, sprain.  Patient is awake and alert, hemodynamically stable and neurovascularly intact.   There is mild swelling noted to the left wrist, though she has no open wounds.  She has normal intrinsic muscle function of the hand.  She has tenderness to palpation over the dorsum of the wrist.  X-ray obtained reveals a comminuted and intra-articular distal radius fracture.  This has occurred over 24 hours ago.  This  was reviewed with orthopedics who feels that she will likely require surgery.  Patient does not want a reduction in the emergency department today, prefers to be splinted only.  She understands the risk of not completely reducing her wrist today including a poorer outcome.  She agrees to follow-up with orthopedics this week.  She was placed in a sugar-tong splint by the paramedic.  We discussed splint care and return precautions.  She also understands the importance of close outpatient follow-up with orthopedics.  The appropriate follow-up information was provided.  She was offered analgesia, declines narcotics, agreed to naproxen which provided good relief.  Prescription was sent for her per her request.  We discussed return precautions outpatient follow-up.  Patient understands and agrees with plan.  She was discharged in stable condition.   Patient's presentation is most consistent with acute presentation with potential threat to life or bodily function.   Clinical Course as of 10/17/22 1020  Sat Oct 17, 2022  0809 Patient declined reduction [JP]    Clinical Course User Index [JP] Lauriann Milillo, Herb Grays, PA-C     FINAL CLINICAL IMPRESSION(S) / ED DIAGNOSES   Final diagnoses:  Other closed intra-articular fracture of distal end of left radius, initial encounter     Rx / DC Orders   ED Discharge Orders          Ordered    naproxen (NAPROSYN) 500 MG tablet  2 times daily with meals        10/17/22 0840             Note:  This document was prepared using Dragon voice recognition software and may include unintentional dictation errors.   Jackelyn Hoehn, PA-C 10/17/22 1020    Merwyn Katos, MD 10/17/22 [phone]

## 2022-10-17 NOTE — ED Triage Notes (Signed)
Pt reports she tripped over a suitcase and fell backwards yesterday while in a hotel in [place]. Pt reports L wrist and forearm pain. Presents with a splint in place that EMS put on in [place]. Pt ambulatory to triage. Alert and oriented following commands. Breathing unlabored speaking in full sentences.

## 2022-10-17 NOTE — Discharge Instructions (Signed)
Please call the orthopedic office to arrange follow-up.  Please keep your splint clean and dry.  Please return for any new, worsening, or changing symptoms or other concerns.  It was a pleasure caring for you today.

## 2022-10-19 ENCOUNTER — Other Ambulatory Visit: Payer: Self-pay | Admitting: Orthopedic Surgery

## 2022-10-19 ENCOUNTER — Encounter: Payer: Self-pay | Admitting: Orthopedic Surgery

## 2022-10-19 NOTE — Anesthesia Preprocedure Evaluation (Signed)
Anesthesia Evaluation  Patient identified by MRN, date of birth, ID band Patient awake    Reviewed: Allergy & Precautions, H&P , NPO status , Patient's Chart, lab work & pertinent test results  Airway Mallampati: III  TM Distance: >3 FB Neck ROM: Full    Dental no notable dental hx.    Pulmonary neg pulmonary ROS, sleep apnea    Pulmonary exam normal breath sounds clear to auscultation       Cardiovascular hypertension, Normal cardiovascular exam Rhythm:Regular Rate:Normal     Neuro/Psych negative neurological ROS  negative psych ROS   GI/Hepatic negative GI ROS, Neg liver ROS,GERD  ,,  Endo/Other  diabetes    Renal/GU Renal diseasenegative Renal ROS  negative genitourinary   Musculoskeletal negative musculoskeletal ROS (+) Arthritis ,    Abdominal   Peds negative pediatric ROS (+)  Hematology negative hematology ROS (+)   Anesthesia Other Findings Hx Roux-En-Y Hx vasovagal reaction Hypertension  Sleep apnea Arthritis  Diabetes mellitus Heartburn  Wears contact lenses Chronic renal impairment, stage 3a Hypertensive renal disease    Reproductive/Obstetrics negative OB ROS                              Anesthesia Physical Anesthesia Plan  ASA: 2  Anesthesia Plan:    Post-op Pain Management: Regional block   Induction: Intravenous  PONV Risk Score and Plan:   Airway Management Planned: LMA  Additional Equipment:   Intra-op Plan:   Post-operative Plan: Extubation in OR  Informed Consent: I have reviewed the patients History and Physical, chart, labs and discussed the procedure including the risks, benefits and alternatives for the proposed anesthesia with the patient or authorized representative who has indicated his/her understanding and acceptance.     Dental Advisory Given  Plan Discussed with: Anesthesiologist, CRNA and Surgeon  Anesthesia Plan Comments:  (Patient consented for risks of anesthesia including but not limited to:  - adverse reactions to medications - damage to eyes, teeth, lips or other oral mucosa - nerve damage due to positioning  - sore throat or hoarseness - Damage to heart, brain, nerves, lungs, other parts of body or loss of life  Patient voiced understanding.)        Anesthesia Quick Evaluation

## 2022-10-20 ENCOUNTER — Ambulatory Visit: Payer: 59 | Admitting: Anesthesiology

## 2022-10-20 ENCOUNTER — Ambulatory Visit
Admission: RE | Admit: 2022-10-20 | Discharge: 2022-10-20 | Disposition: A | Discharge status: Home / Self Care | Payer: 59 | Attending: Orthopedic Surgery | Admitting: Orthopedic Surgery

## 2022-10-20 ENCOUNTER — Other Ambulatory Visit: Payer: Self-pay

## 2022-10-20 ENCOUNTER — Ambulatory Visit: Payer: Self-pay | Admitting: Anesthesiology

## 2022-10-20 ENCOUNTER — Encounter: Payer: Self-pay | Admitting: Orthopedic Surgery

## 2022-10-20 ENCOUNTER — Encounter
Admission: RE | Disposition: A | Discharge status: Home / Self Care | Payer: Self-pay | Source: Home / Self Care | Attending: Orthopedic Surgery

## 2022-10-20 DIAGNOSIS — S52572A Other intraarticular fracture of lower end of left radius, initial encounter for closed fracture: Secondary | ICD-10-CM | POA: Diagnosis present

## 2022-10-20 DIAGNOSIS — I10 Essential (primary) hypertension: Secondary | ICD-10-CM | POA: Insufficient documentation

## 2022-10-20 DIAGNOSIS — Z7985 Long-term (current) use of injectable non-insulin antidiabetic drugs: Secondary | ICD-10-CM | POA: Insufficient documentation

## 2022-10-20 DIAGNOSIS — W010XXA Fall on same level from slipping, tripping and stumbling without subsequent striking against object, initial encounter: Secondary | ICD-10-CM | POA: Diagnosis not present

## 2022-10-20 DIAGNOSIS — E119 Type 2 diabetes mellitus without complications: Secondary | ICD-10-CM | POA: Insufficient documentation

## 2022-10-20 HISTORY — DX: Presence of spectacles and contact lenses: Z97.3

## 2022-10-20 HISTORY — DX: Other specified disorders of kidney and ureter: N28.89

## 2022-10-20 HISTORY — DX: Hypertensive chronic kidney disease with stage 1 through stage 4 chronic kidney disease, or unspecified chronic kidney disease: I12.9

## 2022-10-20 HISTORY — DX: Chronic kidney disease, stage 3a: N18.31

## 2022-10-20 HISTORY — PX: OPEN REDUCTION INTERNAL FIXATION (ORIF) DISTAL RADIAL FRACTURE: SHX5989

## 2022-10-20 LAB — GLUCOSE, CAPILLARY: Glucose-Capillary: 95 mg/dL (ref 70–99)

## 2022-10-20 SURGERY — OPEN REDUCTION INTERNAL FIXATION (ORIF) DISTAL RADIUS FRACTURE
Anesthesia: General | Laterality: Left

## 2022-10-20 MED ORDER — HYDROCODONE-ACETAMINOPHEN 5-325 MG PO TABS: 1.0000 | ORAL_TABLET | ORAL | Status: DC | PRN

## 2022-10-20 MED ORDER — ACETAMINOPHEN 325 MG PO TABS: 325.0000 mg | ORAL_TABLET | Freq: Four times a day (QID) | ORAL | Status: DC | PRN

## 2022-10-20 MED ORDER — DEXAMETHASONE SODIUM PHOSPHATE 10 MG/ML IJ SOLN
INTRAMUSCULAR | Status: DC | PRN
Start: 2022-10-20 — End: 2022-10-20
  Administered 2022-10-20: 10 mg

## 2022-10-20 MED ORDER — ONDANSETRON HCL 4 MG/2ML IJ SOLN: 4.0000 mg | Freq: Four times a day (QID) | INTRAMUSCULAR | Status: DC | PRN

## 2022-10-20 MED ORDER — 0.9 % SODIUM CHLORIDE (POUR BTL) OPTIME
TOPICAL | Status: DC | PRN
  Administered 2022-10-20: 500 mL

## 2022-10-20 MED ORDER — PROPOFOL 10 MG/ML IV BOLUS
INTRAVENOUS | Status: DC | PRN
Start: 2022-10-20 — End: 2022-10-20
  Administered 2022-10-20: 170 mg via INTRAVENOUS

## 2022-10-20 MED ORDER — METOCLOPRAMIDE HCL 5 MG/ML IJ SOLN: 5.0000 mg | Freq: Three times a day (TID) | INTRAMUSCULAR | Status: DC | PRN

## 2022-10-20 MED ORDER — FENTANYL CITRATE (PF) 100 MCG/2ML IJ SOLN
100.0000 ug | Freq: Once | INTRAMUSCULAR | Status: AC
  Administered 2022-10-20: 100 ug via INTRAVENOUS

## 2022-10-20 MED ORDER — ONDANSETRON HCL 4 MG/2ML IJ SOLN
INTRAMUSCULAR | Status: DC | PRN
  Administered 2022-10-20: 4 mg via INTRAVENOUS

## 2022-10-20 MED ORDER — LACTATED RINGERS IV SOLN: INTRAVENOUS | Status: DC

## 2022-10-20 MED ORDER — MIDAZOLAM HCL 2 MG/2ML IJ SOLN
2.0000 mg | Freq: Once | INTRAMUSCULAR | Status: AC
  Administered 2022-10-20: 2 mg via INTRAVENOUS

## 2022-10-20 MED ORDER — BUPIVACAINE HCL (PF) 0.5 % IJ SOLN
INTRAMUSCULAR | Status: DC | PRN
  Administered 2022-10-20: 20 mL via PERINEURAL

## 2022-10-20 MED ORDER — CEFAZOLIN SODIUM-DEXTROSE 2-4 GM/100ML-% IV SOLN
2.0000 g | INTRAVENOUS | Status: AC
  Administered 2022-10-20: 2 g via INTRAVENOUS

## 2022-10-20 MED ORDER — HYDROCODONE-ACETAMINOPHEN 5-325 MG PO TABS: 1.0000 | ORAL_TABLET | ORAL | 0 refills | Status: AC | PRN

## 2022-10-20 MED ORDER — MORPHINE SULFATE (PF) 2 MG/ML IV SOLN: 0.5000 mg | INTRAVENOUS | Status: DC | PRN

## 2022-10-20 MED ORDER — SODIUM CHLORIDE 0.9 % IV SOLN: INTRAVENOUS | Status: DC

## 2022-10-20 MED ORDER — LIDOCAINE HCL (CARDIAC) PF 100 MG/5ML IV SOSY
PREFILLED_SYRINGE | INTRAVENOUS | Status: DC | PRN
  Administered 2022-10-20: 50 mg via INTRATRACHEAL

## 2022-10-20 MED ORDER — LIDOCAINE-EPINEPHRINE (PF) 2 %-1:200000 IJ SOLN
INTRAMUSCULAR | Status: DC | PRN
  Administered 2022-10-20: 10 mL via PERINEURAL

## 2022-10-20 MED ORDER — ONDANSETRON HCL 4 MG PO TABS: 4.0000 mg | ORAL_TABLET | Freq: Four times a day (QID) | ORAL | Status: DC | PRN

## 2022-10-20 MED ORDER — METOCLOPRAMIDE HCL 5 MG PO TABS: 5.0000 mg | ORAL_TABLET | Freq: Three times a day (TID) | ORAL | Status: DC | PRN

## 2022-10-20 MED ORDER — HYDROCODONE-ACETAMINOPHEN 7.5-325 MG PO TABS: 1.0000 | ORAL_TABLET | ORAL | Status: DC | PRN

## 2022-10-20 SURGICAL SUPPLY — 34 items
APL PRP STRL LF DISP 70% ISPRP (MISCELLANEOUS) ×1
BIT DRILL 2 FAST STEP (BIT) IMPLANT
BIT DRILL 2.5X4 QC (BIT) IMPLANT
BNDG CMPR STD VLCR NS LF 5.8X3 (GAUZE/BANDAGES/DRESSINGS) ×1
BNDG ELASTIC 3X5.8 VLCR NS LF (GAUZE/BANDAGES/DRESSINGS) ×1 IMPLANT
CHLORAPREP W/TINT 26 (MISCELLANEOUS) ×1 IMPLANT
COVER LIGHT HANDLE UNIVERSAL (MISCELLANEOUS) ×2 IMPLANT
DRAPE FLUOR MINI C-ARM 54X84 (DRAPES) ×1 IMPLANT
ELECT REM PT RETURN 9FT ADLT (ELECTROSURGICAL) ×1
ELECTRODE REM PT RTRN 9FT ADLT (ELECTROSURGICAL) ×1 IMPLANT
GAUZE SPONGE 4X4 12PLY STRL (GAUZE/BANDAGES/DRESSINGS) ×1 IMPLANT
GAUZE XEROFORM 1X8 LF (GAUZE/BANDAGES/DRESSINGS) ×1 IMPLANT
GLOVE SURG SYN 9.0 PF PI (GLOVE) ×1 IMPLANT
GOWN STRL REIN 2XL XLG LVL4 (GOWN DISPOSABLE) ×1 IMPLANT
GOWN STRL REUS W/ TWL LRG LVL3 (GOWN DISPOSABLE) ×1 IMPLANT
GOWN STRL REUS W/TWL LRG LVL3 (GOWN DISPOSABLE) ×1
K-WIRE 1.6 (WIRE) ×1
K-WIRE FX5X1.6XNS BN SS (WIRE) ×1
KIT TURNOVER KIT A (KITS) ×1 IMPLANT
KWIRE FX5X1.6XNS BN SS (WIRE) IMPLANT
NS IRRIG 500ML POUR BTL (IV SOLUTION) ×1 IMPLANT
PACK EXTREMITY ARMC (MISCELLANEOUS) ×1 IMPLANT
PADDING CAST BLEND 3X4 STRL (MISCELLANEOUS) ×2 IMPLANT
PEG SUBCHONDRAL SMOOTH 2.0X16 (Peg) IMPLANT
PEG SUBCHONDRAL SMOOTH 2.0X18 (Peg) IMPLANT
PEG SUBCHONDRAL SMOOTH 2.0X20 (Peg) IMPLANT
PEG SUBCHONDRAL SMOOTH 2.0X22 (Peg) IMPLANT
PLATE SHORT 21.6X48.9 NRRW LT (Plate) IMPLANT
SCREW CORT 3.5X10 LNG (Screw) IMPLANT
SPLINT CAST 1 STEP 3X12 (MISCELLANEOUS) ×1 IMPLANT
SUT ETHILON 4-0 (SUTURE) ×1
SUT ETHILON 4-0 FS2 18XMFL BLK (SUTURE) ×1
SUT VICRYL 3-0 27IN (SUTURE) ×1 IMPLANT
SUTURE ETHLN 4-0 FS2 18XMF BLK (SUTURE) ×1 IMPLANT

## 2022-10-20 NOTE — Anesthesia Procedure Notes (Signed)
Procedure Name: LMA Insertion Date/Time: 10/20/2022 2:12 PM  Performed by: Domenic Moras, CRNAPre-anesthesia Checklist: Patient identified, Emergency Drugs available, Suction available and Patient being monitored Patient Re-evaluated:Patient Re-evaluated prior to induction Oxygen Delivery Method: Circle system utilized Preoxygenation: Pre-oxygenation with 100% oxygen Induction Type: IV induction Ventilation: Mask ventilation without difficulty LMA: LMA inserted LMA Size: 4.0 Number of attempts: 1 Placement Confirmation: ETT inserted through vocal cords under direct vision, positive ETCO2 and breath sounds checked- equal and bilateral Tube secured with: Tape Dental Injury: Teeth and Oropharynx as per pre-operative assessment

## 2022-10-20 NOTE — H&P (Signed)
Chief Complaint  Patient presents with   Left Wrist - Pain, Edema      Fall, DOI 10/16/2022      History of the Present Illness: Ashley Hardy is a 67 y.o. female here today.    The patient is a 67 year old right-hand-dominant female who suffered a left distal radius fracture on 10/17/2022, displaced intra-articular fracture of the distal radius. She was seen at the [facility] Emergency Room and is now presenting for further evaluation and possible surgery.   She recounts an incident at a convention in [place] where she tripped over a suitcase and fell backwards. She denies any numbness or tingling in her fingers.    Her medical history includes diabetes and hypertension, both of which are well-managed.   The patient is a Optometrist.    I have reviewed past medical, surgical, social and family history, and allergies as documented in the EMR.     Past Medical History: Past Medical History      Past Medical History:  Diagnosis Date   Hyperlipidemia     Hypertension          Past Surgical History: Past Surgical History       Past Surgical History:  Procedure Laterality Date   COLONOSCOPY   08/10/2011    Adenomatous Polyp: CBF 08/2014; Recall Ltr mailed 06/14/2014 (dw)   EGD   08/10/2011    No repeat per RTE   HYSTERECTOMY       TONSILLECTOMY            Past Family History: Family History       Family History  Problem Relation Age of Onset   Diabetes type II Mother         Medications: Current Medications        Current Outpatient Medications  Medication Sig Dispense Refill   amLODIPine (NORVASC) 5 MG tablet Take 5 mg by mouth once daily       hydroCHLOROthiazide (HYDRODIURIL) 12.5 MG tablet Take 1 tablet by mouth once daily       naproxen (NAPROSYN) 500 MG tablet Take 500 mg by mouth 2 (two) times daily with meals       OZEMPIC 0.25 mg or 0.5 mg (2 mg/3 mL) pen injector Inject 0.5 mg subcutaneously once a week       rosuvastatin (CRESTOR) 5 MG tablet  Take 1 tablet by mouth once daily        No current facility-administered medications for this visit.        Allergies: Allergies      Allergies  Allergen Reactions   Amoxicillin-Pot Clavulanate Hives        Body mass index is 33.03 kg/m.     Review of Systems: A comprehensive 14 point ROS was performed, reviewed, and the pertinent orthopaedic findings are documented in the HPI.        Vitals:    10/19/22 1141  BP: 128/86        General Physical Examination:    Lungs are clear.  Heart rate and rhythm is normal.  HEENT is normal.     Radiographs:   X-rays of the left wrist were ordered at [facility] and personally reviewed. These reveal a displaced intra-articular fracture of the distal radius.     Assessment:     ICD-10-CM  1. Other closed intra-articular fracture of distal end of left radius, initial encounter  S52.572A        Plan:  The patient has clinical findings of displaced intra-articular fracture of the distal radius, nondominant hand.   The plan is for open reduction internal fixation. The surgery is scheduled for tomorrow.    Surgical Risks:   The nature of the condition and the proposed procedure has been reviewed in detail with the patient. Surgical versus non-surgical options and prognosis for recovery have been reviewed and the inherent risks and benefits of each have been discussed including the risks of infection, bleeding, injury to nerves/blood vessels/tendons, incomplete relief of symptoms, persisting pain and/or stiffness, loss of function, complex regional pain syndrome, failure of the procedure, as appropriate.     Document Attestation: I, Suan Halter, have reviewed and updated documentation for [facility], MD, utilizing [employer] DAX.    Reviewed  H+P. No changes noted.   Reviewed  H+P. No changes noted.

## 2022-10-20 NOTE — Progress Notes (Signed)
Assisted [place] ANMD with left, interscalene , ultrasound guided block. Side rails up, monitors on throughout procedure. See vital signs in flow sheet. Tolerated Procedure well.

## 2022-10-20 NOTE — Transfer of Care (Signed)
Immediate Anesthesia Transfer of Care Note  Patient: Ashley Hardy  Procedure(s) Performed: OPEN REDUCTION INTERNAL FIXATION (ORIF) DISTAL RADIUS FRACTURE (Left)  Patient Location: PACU  Anesthesia Type: General  Level of Consciousness: awake, alert  and patient cooperative  Airway and Oxygen Therapy: Patient Spontanous Breathing and Patient connected to supplemental oxygen  Post-op Assessment: Post-op Vital signs reviewed, Patient's Cardiovascular Status Stable, Respiratory Function Stable, Patent Airway and No signs of Nausea or vomiting  Post-op Vital Signs: Reviewed and stable  Complications: No notable events documented.

## 2022-10-20 NOTE — Anesthesia Procedure Notes (Signed)
Anesthesia Regional Block: Supraclavicular block   Pre-Anesthetic Checklist: , timeout performed,  Correct Patient, Correct Site, Correct Laterality,  Correct Procedure, Correct Position, site marked,  Risks and benefits discussed,  Surgical consent,  Pre-op evaluation,  At surgeon's request and post-op pain management  Laterality: Left  Prep: chloraprep       Needles:  Injection technique: Single-shot  Needle Type: Echogenic Stimulator Needle      Needle Gauge: 21     Additional Needles:   Procedures:,,,, ultrasound used (permanent image in chart),,   Motor weakness within 10 minutes.  Narrative:  Start time: 10/20/2022 12:37 PM End time: 10/20/2022 12:46 PM Injection made incrementally with aspirations every 5 mL.  Performed by: Personally  Anesthesiologist: Marisue Humble, MD  Additional Notes: Functioning IV was confirmed and monitors applied.  Sterile prep and drape,hand hygiene and sterile gloves were used.Ultrasound guidance: relevant anatomy identified, needle position confirmed, local anesthetic spread visualized around nerve(s)., vascular puncture avoided.  Image printed for medical record.  Negative aspiration and negative test dose prior to incremental administration of local anesthetic. The patient tolerated the procedure well. Vitals signes recorded in RN notes. Also intercostobrachial block

## 2022-10-20 NOTE — Discharge Instructions (Signed)
Keep arm elevated is much as possible to keep swelling down Work on finger motion all you can Pain medicine as directed Call office if you are having any problems [phone] Keep dressing clean and dry

## 2022-10-20 NOTE — Op Note (Signed)
10/20/2022  2:54 PM  PATIENT:  Ashley Hardy  67 y.o. female  PRE-OPERATIVE DIAGNOSIS:  Other closed intra-articular fracture of distal end of left radius, initial encounter  POST-OPERATIVE DIAGNOSIS:  Other closed intra-articular fracture of distal end of left radius  PROCEDURE:  Procedure(s) with comments: OPEN REDUCTION INTERNAL FIXATION (ORIF) DISTAL RADIUS FRACTURE (Left) - Diabetic  SURGEON: Leitha Schuller, MD  ASSISTANTS: None  ANESTHESIA:   general  EBL:  Total I/O In: 200 [I.V.:200] Out: -   BLOOD ADMINISTERED:none  DRAINS: none   LOCAL MEDICATIONS USED:  OTHER preoperative block given by anesthesia  SPECIMEN:  No Specimen  DISPOSITION OF SPECIMEN:  N/A  COUNTS:  YES  TOURNIQUET:   Total Tourniquet Time Documented: Upper Arm (Left) - 140 minutes Total: Upper Arm (Left) - 140 minutes   IMPLANTS: Left short narrow DVR plate with multiple smooth pegs and screws  DICTATION: .Dragon Dictation patient was brought to the operating room and after the arm was prepped and draped in the usual sterile fashion after a regional block placed by anesthesia before coming back to the OR was obtained.   Tourniquet having been applied to the upper arm.  Appropriate patient identification and timeout procedures were completed.  Tourniquet was raised and a volar approach was made after traction was applied at the end of the table through fingertrap traction.  Approach is center of the FCR tendon.  Tendon sheath incised and the tendon retracted radially protect the radial artery and associated veins.  The deep fascia was incised and the pronator was elevated off the distal and proximal fragments with exposure of the fracture site.  The fracture was intra articular with multiple fragments and with traction applied and Freer elevator adequate alignment was obtained.  A short narrow DVR plate applied with distal first technique using a K wire to hold the plate in position position of  the plate was checked in both AP and lateral projections.  When acceptable position was obtained the smooth pegs were placed drilling measuring and placing the smooth pegs in the distal fragments  following this the shaft portion of the plate was brought down to the bone and 3  cortical screws were sequentially inserted they gave an essentially anatomic alignment in both AP and lateral projections.  Traction was removed and under fluoroscopic views there is no motion of the fracture.   The wound irrigated with tourniquet let down.  Wound was then closed with 3-0 Vicryl for the deep tissue and 4-0 nylon in a simple interrupted manner..  Dressing of 4 x 4's web roll and volar splint followed by Ace wrap applied.   PLAN OF CARE: Discharge to home after PACU  PATIENT DISPOSITION:  PACU - hemodynamically stable.

## 2022-10-21 ENCOUNTER — Encounter: Payer: Self-pay | Admitting: Orthopedic Surgery

## 2022-10-21 NOTE — Anesthesia Postprocedure Evaluation (Signed)
Anesthesia Post Note  Patient: RANASIA MASSARELLI Ray  Procedure(s) Performed: OPEN REDUCTION INTERNAL FIXATION (ORIF) DISTAL RADIUS FRACTURE (Left)  Patient location during evaluation: PACU Anesthesia Type: General Level of consciousness: awake and alert Pain management: pain level controlled Vital Signs Assessment: post-procedure vital signs reviewed and stable Respiratory status: spontaneous breathing, nonlabored ventilation, respiratory function stable and patient connected to nasal cannula oxygen Cardiovascular status: blood pressure returned to baseline and stable Postop Assessment: no apparent nausea or vomiting Anesthetic complications: no   No notable events documented.   Last Vitals:  Vitals:   10/20/22 1500 10/20/22 1515  BP: 129/73 118/70  Pulse: 83 76  Resp: 13 10  Temp:  (!) 36.1 C  SpO2: 95% 96%    Last Pain:  Vitals:   10/20/22 1515  TempSrc:   PainSc: 0-No pain                 Ringo Sherod C Borna Wessinger

## 2022-11-12 ENCOUNTER — Other Ambulatory Visit: Payer: Self-pay | Admitting: Internal Medicine

## 2022-11-12 DIAGNOSIS — Z1231 Encounter for screening mammogram for malignant neoplasm of breast: Secondary | ICD-10-CM

## 2022-11-18 ENCOUNTER — Ambulatory Visit
Admission: RE | Admit: 2022-11-18 | Discharge: 2022-11-18 | Disposition: A | Discharge status: Home / Self Care | Payer: 59 | Source: Ambulatory Visit | Attending: Internal Medicine | Admitting: Internal Medicine

## 2022-11-18 DIAGNOSIS — Z1231 Encounter for screening mammogram for malignant neoplasm of breast: Secondary | ICD-10-CM

## 2023-07-12 ENCOUNTER — Ambulatory Visit: Payer: 59 | Admitting: Dermatology

## 2023-07-12 VITALS — BP 129/75 | HR 62

## 2023-07-12 DIAGNOSIS — L910 Hypertrophic scar: Secondary | ICD-10-CM

## 2023-07-12 MED ORDER — TRIAMCINOLONE ACETONIDE 40 MG/ML IJ SUSP
40.0000 mg | Freq: Once | INTRAMUSCULAR | Status: DC
Start: 2023-07-12 — End: 2023-10-14

## 2023-07-12 NOTE — Patient Instructions (Addendum)

## 2023-07-12 NOTE — Progress Notes (Signed)
   New Patient Visit   Subjective  Ashley Hardy is a 68 y.o. female who presents for the following: Keloid  Patient states she has keloid located at the abdomen that she would like to have examined. 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 Patient reports the areas have been there for 8 years. She reports the areas are bothersome. Patient rates irritation 9 out of 10.  Patient reports she has not previously been treated for these areas.  The following portions of the chart were reviewed this encounter and updated as appropriate: medications, allergies, medical history  Review of Systems:  No other skin or systemic complaints except as noted in HPI or Assessment and Plan.  Objective  Well appearing patient in no apparent distress; mood and affect are within normal limits.  A focused examination was performed of the following areas: Abdomen  Relevant exam findings are noted in the Assessment and Plan.            Assessment & Plan   KELOID Exam: shows Firm pink/brown dermal papule(s)/plaque(s). Linear hypertrophic scar at the left wrist   Treatment Plan: - ILK Injections completed while in office today - We will plan to continue injections 6 weeks  KELOID (2) Left Forearm - Anterior, Right Abdomen (side) - Upper Procedure Note Intralesional Injection  Location: Medial Abdomen and Left Wrist  Informed Consent: Discussed risks (infection, pain, bleeding, bruising, thinning of the skin, loss of skin pigment, lack of resolution, and recurrence of lesion) and benefits of the procedure, as well as the alternatives. Informed consent was obtained. Preparation: The area was prepared a standard fashion.  Anesthesia: None  Procedure Details: An intralesional injection was performed with Kenalog 40 mg/cc. 1 cc in total were injected. NDC #: [address]-1 Exp: 07/2024 LOT: [id number]  Total number of injections: 8  Plan: The patient was instructed on post-op  care. Recommend OTC analgesia as needed for pain.  Related Medications triamcinolone acetonide (KENALOG-40) injection 40 mg   Return in about 6 weeks (around 08/23/2023) for 6-8 wks Keloid F/U.  I, Jetta Ager, am acting as Neurosurgeon for [employer], DO.  Documentation: I have reviewed the above documentation for accuracy and completeness, and I agree with the above.  Louana Roup, DO

## 2023-08-06 ENCOUNTER — Encounter (HOSPITAL_COMMUNITY): Payer: Self-pay | Admitting: *Deleted

## 2023-08-25 ENCOUNTER — Ambulatory Visit: Admitting: Dermatology

## 2023-08-25 ENCOUNTER — Encounter: Payer: Self-pay | Admitting: Dermatology

## 2023-08-25 VITALS — BP 112/65

## 2023-08-25 DIAGNOSIS — L910 Hypertrophic scar: Secondary | ICD-10-CM

## 2023-08-25 MED ORDER — TRIAMCINOLONE ACETONIDE 40 MG/ML IJ SUSP
40.0000 mg | Freq: Once | INTRAMUSCULAR | Status: AC
Start: 2023-08-25 — End: 2023-08-25
  Administered 2023-08-25: 40 mg via INTRAMUSCULAR

## 2023-08-25 NOTE — Patient Instructions (Signed)

## 2023-08-25 NOTE — Progress Notes (Signed)
   Follow-Up Visit   Subjective  Ashley Hardy is a 68 y.o. female who presents for the following: keloid  Patient present today for follow up visit. Patient was last evaluated on 07/12/23. At this visit Kenalog  injections were done on abdomen and L wrist. Patient reports sxs are better. Patient denies medication changes.  The following portions of the chart were reviewed this encounter and updated as appropriate: medications, allergies, medical history  Review of Systems:  No other skin or systemic complaints except as noted in HPI or Assessment and Plan.  Objective  Well appearing patient in no apparent distress; mood and affect are within normal limits.   A focused examination was performed of the following areas: abdomen and L wrist   Relevant exam findings are noted in the Assessment and Plan.      Left Forearm - Anterior (8), Right Abdomen (side) - Upper (2) Firm pink/brown dermal papule(s)/plaque(s).   Assessment & Plan   KELOID Exam shows Firm pink/brown dermal papule(s)/plaque(s).  KELOID (10) Left Forearm - Anterior (8), Right Abdomen (side) - Upper (2) Procedure Note Intralesional Injection  Location: Medial Abdomen and Left Wrist  Informed Consent: Discussed risks (infection, pain, bleeding, bruising, thinning of the skin, loss of skin pigment, lack of resolution, and recurrence of lesion) and benefits of the procedure, as well as the alternatives. Informed consent was obtained. Preparation: The area was prepared a standard fashion.  Anesthesia:None  Procedure Details: An intralesional injection was performed with Kenalog  40 mg/cc. 0.15 cc in total were injected. NDC #: [id number]  Exp: 07/2024  Total number of injections: 10  Plan: The patient was instructed on post-op care. Recommend OTC analgesia as needed for pain.  Related Medications triamcinolone  acetonide (KENALOG -40) injection 40 mg  triamcinolone  acetonide (KENALOG -40) injection 40  mg   Return for keloid in 6-8 weeks.  Documentation: I have reviewed the above documentation for accuracy and completeness, and I agree with the above.   I, Shirron Louanne Roussel, CMA, am acting as scribe for [employer], DO.   Louana Roup, DO

## 2023-10-14 ENCOUNTER — Ambulatory Visit: Admitting: Dermatology

## 2023-10-14 VITALS — BP 128/69

## 2023-10-14 DIAGNOSIS — L910 Hypertrophic scar: Secondary | ICD-10-CM

## 2023-10-14 MED ORDER — TRIAMCINOLONE ACETONIDE 10 MG/ML IJ SUSP
10.0000 mg | Freq: Once | INTRAMUSCULAR | Status: AC
Start: 2023-10-14 — End: 2023-10-14
  Administered 2023-10-14: 10 mg via INTRADERMAL

## 2023-10-14 MED ORDER — TRIAMCINOLONE ACETONIDE 40 MG/ML IJ SUSP
40.0000 mg | Freq: Once | INTRAMUSCULAR | Status: AC
Start: 2023-10-14 — End: 2023-10-14
  Administered 2023-10-14: 40 mg via INTRADERMAL

## 2023-10-14 NOTE — Patient Instructions (Signed)

## 2023-10-14 NOTE — Progress Notes (Signed)
   Follow-Up Visit   Subjective  Ashley Hardy is a 68 y.o. female who presents for the following: Keloid follow up of mid abdomen and left wrist - Il injection Kenalog  40 07/12/23 and 08/25/23. They have improved, especially the one on the abdomen.0   The following portions of the chart were reviewed this encounter and updated as appropriate: medications, allergies, medical history  Review of Systems:  No other skin or systemic complaints except as noted in HPI or Assessment and Plan.  Objective  Well appearing patient in no apparent distress; mood and affect are within normal limits.  A focused examination was performed of the following areas: Abdomen, left arm  Relevant exam findings are noted in the Assessment and Plan.    Assessment & Plan     KELOID (2) Left Wrist, Mid abdomen Intralesional injection - Left Wrist Location: Left wrist  Informed Consent: Discussed risks (infection, pain, bleeding, bruising, thinning of the skin, loss of skin pigment, lack of resolution, and recurrence of lesion) and benefits of the procedure, as well as the alternatives. Informed consent was obtained. Preparation: The area was prepared a standard fashion.  Anesthesia: none  Procedure Details: An intralesional injection was performed with Kenalog  40 mg/cc. 0.15 cc in total were injected.  Total number of injections: 6  Plan: The patient was instructed on post-op care. Recommend OTC analgesia as needed for pain.   Intralesional injection - Mid abdomen Location: Mid abdomen  Informed Consent: Discussed risks (infection, pain, bleeding, bruising, thinning of the skin, loss of skin pigment, lack of resolution, and recurrence of lesion) and benefits of the procedure, as well as the alternatives. Informed consent was obtained. Preparation: The area was prepared a standard fashion.  Anesthesia: none  Procedure Details: An intralesional injection was performed with Kenalog  10 mg/cc. 0.1  cc in total were injected.  Total number of injections: 4  Plan: The patient was instructed on post-op care. Recommend OTC analgesia as needed for pain.   Related Medications triamcinolone  acetonide (KENALOG ) 10 MG/ML injection 10 mg  triamcinolone  acetonide (KENALOG -40) injection 40 mg   Return in about 8 weeks (around 12/09/2023) for Keloid.  I, Roseline Hutchinson, CMA, am acting as scribe for [employer], DO .   Documentation: I have reviewed the above documentation for accuracy and completeness, and I agree with the above.  Delon Lenis, DO

## 2023-12-08 ENCOUNTER — Ambulatory Visit (INDEPENDENT_AMBULATORY_CARE_PROVIDER_SITE_OTHER): Admitting: Dermatology

## 2023-12-08 ENCOUNTER — Ambulatory Visit: Admitting: Dermatology

## 2023-12-08 ENCOUNTER — Encounter: Payer: Self-pay | Admitting: Dermatology

## 2023-12-08 VITALS — BP 115/70 | HR 59

## 2023-12-08 DIAGNOSIS — N1831 Chronic kidney disease, stage 3a: Secondary | ICD-10-CM | POA: Insufficient documentation

## 2023-12-08 DIAGNOSIS — L910 Hypertrophic scar: Secondary | ICD-10-CM

## 2023-12-08 DIAGNOSIS — E7800 Pure hypercholesterolemia, unspecified: Secondary | ICD-10-CM | POA: Insufficient documentation

## 2023-12-08 MED ORDER — TRIAMCINOLONE ACETONIDE 40 MG/ML IJ SUSP
40.0000 mg | Freq: Once | INTRAMUSCULAR | Status: AC
Start: 2023-12-08 — End: 2023-12-08
  Administered 2023-12-08: 40 mg via INTRAMUSCULAR

## 2023-12-08 NOTE — Progress Notes (Signed)
   Follow-Up Visit   Subjective  Ashley Hardy is a 68 y.o. female who presents for the following: Keloid  Patient present today for follow up visit for Keloid. Patient was last evaluated on 10/14/2023. At this visit patient was given an injection of Kenalog  10mg /cc, 0.1cc.Patient has has a total of 3 sets of ILK injections. Patient reports that spot on abdomen has greatly improved. Patient reports the one on the wrist has improved slightly. The spot on both the abdomen and wrist feel softer to the touch than on previous visits.Patient reports that she is using no other treatments on the spots. Today patient rates irritation 0 out of 10. Patient reports sxs are better. Patient denies medication changes.  The following portions of the chart were reviewed this encounter and updated as appropriate: medications, allergies, medical history  Review of Systems:  No other skin or systemic complaints except as noted in HPI or Assessment and Plan.  Objective  Well appearing patient in no apparent distress; mood and affect are within normal limits.  A focused examination was performed of the following areas: Medial abdomen and left wrist  Relevant exam findings are noted in the Assessment and Plan.        Assessment & Plan  KELOID Exam shows Firm pink/brown dermal papule(s)/plaque(s).  Treatment Plan: Injection of Kenalog  20 0.3cc / 0.1 total injected with 8 injections NDC: [address]-8 Lot: [id number] Exp: 2026-05       KELOID (2) Left Wrist, Mid abdomen  Related Medications triamcinolone  acetonide (KENALOG -40) injection 40 mg   Return in about 6 weeks (around 01/19/2024) for Keloid FU (please schedule between 6-8 weeks at 0:15 appointment slot okay to DB per JD).  LILLETTE Lyle Cords, am acting as Neurosurgeon for [employer], DO.  Documentation: I have reviewed the above documentation for accuracy and completeness, and I agree with the above.  Delon Lenis, DO

## 2023-12-08 NOTE — Patient Instructions (Addendum)
 Scar Care: Education and Recommendations  Following your procedure, it's important to care for your scar to promote optimal healing and minimize its appearance. Proper post-operative care can help ensure that the scar heals well, and with time, it may become less noticeable. Below are key recommendations for scar care, including scar massage and the use of silicone scar gels or sheets.  1. General Scar Care Tips: -  Keep the wound clean and dry: Follow your healthcare provider's instructions for wound care, including cleaning the site and changing dressings as needed. -  Avoid sun exposure: Direct sunlight can darken scars and make them more noticeable. Once your wound has healed, apply sunscreen (SPF 30 or higher) to protect the scar from UV rays.  2. Scar Massage: - Start after healing: Wait until the scar has fully healed, with no scabs or open areas (usually 4-6 weeks after surgery). Your healthcare provider will give you specific guidance on when to begin. - Technique: Gently massage the scar in a circular motion for 5-10 minutes, 2-3 times per day. This helps to soften the tissue, reduce swelling, and improve the overall appearance of the scar. - Pressure: Apply gentle, firm pressure during the massage to break down the dense tissue that may form during healing. This helps to prevent the formation of keloids or hypertrophic scars. - Use lotion or ointment: Consider using a mild, fragrance-free lotion or vitamin E ointment to help lubricate the area during massage.  3. Silicone Scar Gels or Sheets: - When to start: Once your wound has healed completely, typically around 4-6 weeks, you can begin using silicone-based scar gels or sheets. These have been shown to improve scar appearance by hydrating the tissue and reducing inflammation. - How to use silicone gels: Apply a thin layer of the gel to the scar and allow it to dry before covering with clothing. You can use the gel multiple  times a day, depending on your provider's recommendation. - How to use silicone sheets: Cut the sheet to fit the size of your scar, and apply it directly to the healed scar. Wear it for 12-24 hours a day, and replace the sheet every few days as directed. - Benefits: Silicone helps reduce redness, flatten the scar, and improve its texture. Continued use over several months can lead to significant improvement in the appearance of the scar.  4. What to Expect: - Healing process: Scars generally take time to mature. The first few months may show redness or swelling, but this usually improves as healing progresses. - Long-term care: Scarring is a natural part of the healing process. While you cannot completely eliminate a scar, proper care can significantly improve its appearance over time. - Patience: It can take up to a year for a scar to fully mature, so it's important to be consistent with scar care and follow-up appointments with your provider.  5. When to Contact Your Healthcare Provider: - If you notice signs of infection (increased redness, warmth, drainage, or pain). - If your scar becomes unusually raised, itchy, or changes in color significantly. - If you have concerns about the appearance of your scar or experience unusual symptoms. - By following these guidelines, you can support your body's natural healing process and help ensure the best possible outcome for your scar. If you have any questions or concerns, please don't hesitate to contact our office.      Important Information  Due to recent changes in healthcare laws, you may  see results of your pathology and/or laboratory studies on MyChart before the doctors have had a chance to review them. We understand that in some cases there may be results that are confusing or concerning to you. Please understand that not all results are received at the same time and often the doctors may need to interpret multiple results in order to provide  you with the best plan of care or course of treatment. Therefore, we ask that you please give us  2 business days to thoroughly review all your results before contacting the office for clarification. Should we see a critical lab result, you will be contacted sooner.   If You Need Anything After Your Visit  If you have any questions or concerns for your doctor, please call our main line at [phone] If no one answers, please leave a voicemail as directed and we will return your call as soon as possible. Messages left after 4 pm will be answered the following business day.   You may also send us  a message via MyChart. We typically respond to MyChart messages within 1-2 business days.  For prescription refills, please ask your pharmacy to contact our office. Our fax number is [phone].  If you have an urgent issue when the clinic is closed that cannot wait until the next business day, you can page your doctor at the number below.    Please note that while we do our best to be available for urgent issues outside of office hours, we are not available 24/7.   If you have an urgent issue and are unable to reach us , you may choose to seek medical care at your doctor's office, retail clinic, urgent care center, or emergency room.  If you have a medical emergency, please immediately call 911 or go to the emergency department. In the event of inclement weather, please call our main line at [phone] for an update on the status of any delays or closures.  Dermatology Medication Tips: Please keep the boxes that topical medications come in in order to help keep track of the instructions about where and how to use these. Pharmacies typically print the medication instructions only on the boxes and not directly on the medication tubes.   If your medication is too expensive, please contact our office at [phone] or send us  a message through MyChart.   We are unable to tell what your co-pay for  medications will be in advance as this is different depending on your insurance coverage. However, we may be able to find a substitute medication at lower cost or fill out paperwork to get insurance to cover a needed medication.   If a prior authorization is required to get your medication covered by your insurance company, please allow us  1-2 business days to complete this process.  Drug prices often vary depending on where the prescription is filled and some pharmacies may offer cheaper prices.  The website www.goodrx.com contains coupons for medications through different pharmacies. The prices here do not account for what the cost may be with help from insurance (it may be cheaper with your insurance), but the website can give you the price if you did not use any insurance.  - You can print the associated coupon and take it with your prescription to the pharmacy.  - You may also stop by our office during regular business hours and pick up a GoodRx coupon card.  - If you need your prescription sent electronically to a different pharmacy, notify  our office through [facility] or by phone at [phone]

## 2023-12-23 ENCOUNTER — Other Ambulatory Visit (HOSPITAL_BASED_OUTPATIENT_CLINIC_OR_DEPARTMENT_OTHER): Payer: Self-pay | Admitting: Internal Medicine

## 2023-12-23 DIAGNOSIS — Z13820 Encounter for screening for osteoporosis: Secondary | ICD-10-CM

## 2024-02-01 ENCOUNTER — Ambulatory Visit: Admitting: Dermatology

## 2024-04-10 ENCOUNTER — Ambulatory Visit: Admitting: Dermatology

## 2024-05-03 ENCOUNTER — Ambulatory Visit: Admitting: Dermatology
